# Patient Record
Sex: Female | Born: 2000 | Race: Black or African American | Hispanic: No | Marital: Single | Smoking: Never smoker
Health system: Southern US, Community
[De-identification: ages and names within clinical notes are randomized; demographics above are authoritative.]

## PROBLEM LIST (undated history)

## (undated) DIAGNOSIS — H539 Unspecified visual disturbance: Secondary | ICD-10-CM

## (undated) DIAGNOSIS — T7840XA Allergy, unspecified, initial encounter: Secondary | ICD-10-CM

## (undated) DIAGNOSIS — J45909 Unspecified asthma, uncomplicated: Secondary | ICD-10-CM

## (undated) DIAGNOSIS — F819 Developmental disorder of scholastic skills, unspecified: Secondary | ICD-10-CM

## (undated) HISTORY — DX: Developmental disorder of scholastic skills, unspecified: F81.9

## (undated) HISTORY — DX: Allergy, unspecified, initial encounter: T78.40XA

## (undated) HISTORY — DX: Unspecified visual disturbance: H53.9

## (undated) HISTORY — DX: Unspecified asthma, uncomplicated: J45.909

---
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
## 2022-01-01 ENCOUNTER — Encounter: Payer: Self-pay | Admitting: Student

## 2022-01-01 ENCOUNTER — Ambulatory Visit (INDEPENDENT_AMBULATORY_CARE_PROVIDER_SITE_OTHER): Payer: Medicaid Other | Admitting: Student

## 2022-01-01 ENCOUNTER — Other Ambulatory Visit: Payer: Self-pay

## 2022-01-01 VITALS — BP 126/64 | HR 88 | Temp 98.0°F | Ht 67.44 in | Wt 161.8 lb

## 2022-01-01 DIAGNOSIS — Z719 Counseling, unspecified: Secondary | ICD-10-CM

## 2022-01-01 DIAGNOSIS — L853 Xerosis cutis: Secondary | ICD-10-CM

## 2022-01-01 DIAGNOSIS — R55 Syncope and collapse: Secondary | ICD-10-CM

## 2022-01-01 DIAGNOSIS — Z23 Encounter for immunization: Secondary | ICD-10-CM

## 2022-01-01 DIAGNOSIS — Z91018 Allergy to other foods: Secondary | ICD-10-CM | POA: Diagnosis not present

## 2022-01-01 DIAGNOSIS — J4530 Mild persistent asthma, uncomplicated: Secondary | ICD-10-CM

## 2022-01-01 MED ORDER — EPINEPHRINE 0.3 MG/0.3ML IJ SOAJ: INTRAMUSCULAR | 1 refills | Status: DC

## 2022-01-01 MED ORDER — ALBUTEROL SULFATE HFA 108 (90 BASE) MCG/ACT IN AERS: INHALATION_SPRAY | RESPIRATORY_TRACT | 1 refills | Status: DC

## 2022-01-01 MED ORDER — FLUTICASONE PROPIONATE HFA 220 MCG/ACT IN AERO: INHALATION_SPRAY | RESPIRATORY_TRACT | 12 refills | Status: DC

## 2022-01-01 MED ORDER — TRIAMCINOLONE ACETONIDE 0.1 % EX OINT: TOPICAL_OINTMENT | CUTANEOUS | 0 refills | Status: DC

## 2022-01-04 ENCOUNTER — Ambulatory Visit (INDEPENDENT_AMBULATORY_CARE_PROVIDER_SITE_OTHER): Payer: Medicaid Other

## 2022-01-04 ENCOUNTER — Other Ambulatory Visit: Payer: Self-pay

## 2022-01-04 DIAGNOSIS — Z23 Encounter for immunization: Secondary | ICD-10-CM | POA: Diagnosis not present

## 2022-03-24 ENCOUNTER — Encounter: Payer: Self-pay | Admitting: Pediatrics

## 2022-03-24 ENCOUNTER — Ambulatory Visit (INDEPENDENT_AMBULATORY_CARE_PROVIDER_SITE_OTHER): Payer: Medicaid Other | Admitting: Pediatrics

## 2022-03-24 ENCOUNTER — Ambulatory Visit
Admission: RE | Admit: 2022-03-24 | Discharge: 2022-03-24 | Disposition: A | Discharge status: Home / Self Care | Payer: Medicaid Other | Source: Ambulatory Visit | Attending: Pediatrics | Admitting: Pediatrics

## 2022-03-24 VITALS — Wt 162.0 lb

## 2022-03-24 DIAGNOSIS — S99912A Unspecified injury of left ankle, initial encounter: Secondary | ICD-10-CM

## 2022-03-24 DIAGNOSIS — J4530 Mild persistent asthma, uncomplicated: Secondary | ICD-10-CM

## 2022-03-24 DIAGNOSIS — M7989 Other specified soft tissue disorders: Secondary | ICD-10-CM | POA: Diagnosis not present

## 2022-03-24 DIAGNOSIS — L853 Xerosis cutis: Secondary | ICD-10-CM

## 2022-03-24 DIAGNOSIS — Z91018 Allergy to other foods: Secondary | ICD-10-CM

## 2022-03-24 MED ORDER — FLUTICASONE PROPIONATE HFA 220 MCG/ACT IN AERO: INHALATION_SPRAY | RESPIRATORY_TRACT | 12 refills | Status: DC

## 2022-03-24 MED ORDER — TRIAMCINOLONE ACETONIDE 0.1 % EX OINT: TOPICAL_OINTMENT | CUTANEOUS | 0 refills | Status: DC

## 2022-03-24 MED ORDER — EPINEPHRINE 0.3 MG/0.3ML IJ SOAJ: INTRAMUSCULAR | 1 refills | Status: DC

## 2022-03-24 MED ORDER — ALBUTEROL SULFATE HFA 108 (90 BASE) MCG/ACT IN AERS: INHALATION_SPRAY | RESPIRATORY_TRACT | 1 refills | Status: DC

## 2022-03-24 NOTE — Patient Instructions (Addendum)
[facility] ?Address: [address], [place], [place] [address] ?Hours:  ?Open ? Closes 5?PM ?Phone: [phone] ?Appointments: [email or web address] ? ?Ankle Sprain ?An ankle sprain is a stretch or tear in one of the tough tissues (ligaments) that connect the bones in your ankle. An ankle sprain can happen when the ankle rolls outward (inversion sprain) or inward (eversion sprain). ?What are the causes? ?This condition is caused by rolling or twisting the ankle. ?What increases the risk? ?You are more likely to develop this condition if you play sports. ?What are the signs or symptoms? ?Symptoms of this condition include: ?Pain in your ankle. ?Swelling. ?Bruising. This may happen right after you sprain your ankle or 1-2 days later. ?Trouble standing or walking. ?How is this diagnosed? ?This condition is diagnosed with: ?A physical exam. During the exam, your doctor will press on certain parts of your foot and ankle and try to move them in certain ways. ?X-ray imaging. These may be taken to see how bad the sprain is and to check for broken bones. ?How is this treated? ?This condition may be treated with: ?A brace or splint. This is used to keep the ankle from moving until it heals. ?An elastic bandage. This is used to support the ankle. ?Crutches. ?Pain medicine. ?Surgery. This may be needed if the sprain is very bad. ?Physical therapy. This may help to improve movement in the ankle. ?Follow these instructions at home: ?If you have a brace or a splint: ?Wear the brace or splint as told by your doctor. Remove it only as told by your doctor. ?Loosen the brace or splint if your toes: ?Tingle. ?Lose feeling (become numb). ?Turn cold and blue. ?Keep the brace or splint clean. ?If the brace or splint is not waterproof: ?Do not let it get wet. ?Cover it with a watertight covering when you take a bath or a shower. ?If you have an elastic bandage (dressing): ?Remove it to shower or bathe. ?Try not  to move your ankle much, but wiggle your toes from time to time. This helps to prevent swelling. ?Adjust the dressing if it feels too tight. ?Loosen the dressing if your foot: ?Loses feeling. ?Tingles. ?Becomes cold and blue. ?Managing pain, stiffness, and swelling ? ?Take over-the-counter and prescription medicines only as told by doctor. ?For 2-3 days, keep your ankle raised (elevated) above the level of your heart. ?If told, put ice on the injured area: ?If you have a removable brace or splint, remove it as told by your doctor. ?Put ice in a plastic bag. ?Place a towel between your skin and the bag. ?Leave the ice on for 20 minutes, 2-3 times a day. ?General instructions ?Rest your ankle. ?Do not use your injured leg to support your body weight until your doctor says that you can. Use crutches as told by your doctor. ?Do not use any products that contain nicotine or tobacco, such as cigarettes, e-cigarettes, and chewing tobacco. If you need help quitting, ask your doctor. ?Keep all follow-up visits as told by your doctor. ?Contact a doctor if: ?Your bruises or swelling are quickly getting worse. ?Your pain does not get better after you take medicine. ?Get help right away if: ?You cannot feel your toes or foot. ?Your foot or toes look blue. ?You have very bad pain that gets worse. ?Summary ?An ankle sprain is a stretch or tear in one of the tough tissues (ligaments) that connect the bones in your ankle. ?This condition is  caused by rolling or twisting the ankle. ?Symptoms include pain, swelling, bruising, and trouble walking. ?To help with pain and swelling, put ice on the injured ankle, raise your ankle above the level of your heart, and use an elastic bandage. Also, rest as told by your doctor. ?Keep all follow-up visits as told by your doctor. This is important. ?This information is not intended to replace advice given to you by your health care provider. Make sure you discuss any questions you have with your  health care provider. ?Document Revised: 01/31/2021 Document Reviewed: 01/31/2021 ?Elsevier Patient Education ? 2022 [employer]vier Inc[employer] ? ?

## 2022-03-24 NOTE — Progress Notes (Signed)
Subjective:  ?  ?Michelle Lynn is a 21 y.o. old female here with her  self  for Ankle Pain (Left ankle started Thursday during track, pt states that she noticed some swelling and been putting ice on it. Been taking tylenol for pain. ) ?.   ? ?HPI ?Chief Complaint  ?Patient presents with  ? Ankle Pain  ?  Left ankle started Thursday during track, pt states that she noticed some swelling and been putting ice on it. Been taking tylenol for pain.   ? ?20yo here for L ankle injury.  Thurs, pt was at track and Audiological scientist.  She was retrieving a disc (diskus throw) and another athlete threw a disc before making sure the field was clear. The disk hit pt in the anterior ankle.  Ankle felt numb and agitated.  It was bleeding and swollen when she arrived home. Friday pt took ibuprofen for pain.  Pt denies applying ice or wrapping the ankle. Today, pt states she was not able to go to school due to the pain w/ ambulation.  ? ?Review of Systems ? ?History and Problem List: ?Deanda has Mild persistent asthma; Myopia of both eyes; Learning disorder; Overweight, pediatric, BMI 85.0-94.9 percentile for age; Multiple food allergies; and  Chest pain with exercise on their problem list. ? ?Michelle Lynn  has a past medical history of Allergy, Asthma, Learning disorder (11/01/2013), Unspecified asthma(493.90) (11/01/2013), and Vision abnormalities. ? ?Immunizations needed: none ? ?   ?Objective:  ?  ?Wt 162 lb (73.5 kg)   BMI 25.04 kg/m?  ?Physical Exam ?Musculoskeletal:     ?   General: Swelling and signs of injury present.  ?   Comments: 1cm healing laceration on anterior L ankle. Swelling of L ankle w/ tenderness extending to midfoot. Pain w/ passive dorsiflexion.   ? ? ?   ?Assessment and Plan:  ? ?Michelle Lynn is a 21 y.o. old female with ? ?1. Left ankle injury, initial encounter ?Patient presents with signs / symptoms of ankle sprain from diskus trauma.  Clinical exam is consistent with this diagnosis.  No fracture noted on x-rays of affected  area.   Supportive care is recommended and may include the use of ibuprofen and/or acetaminophen for symptomatic pain relief.  Patient / caregiver has been advised on other supportive measures including limited weight bearing / limited use of affected extremity and possible use of orthopedic brace / boot if indicated.  Patient / caregiver also advised to seek orthopedic follow up if indicated based on symptoms and degree of injury. ? ?- DG Ankle Complete Left; Future ?- DME Crutches ?- Ambulatory referral to Orthopedics ? ?2. Mild persistent asthma without complication ?Refill needed. Did not have it filled when ordered 40mos ago. ?- albuterol (PROAIR HFA) 108 (90 Base) MCG/ACT inhaler; INHALE 2 PUFFS BY MOUTH EVERY 4 HOURS BEFPRE EXERCISE AS NEEDED FOR WHEEZING  Dispense: 17 g; Refill: 1 ?- fluticasone (FLOVENT HFA) 220 MCG/ACT inhaler; INHALE 2 PUFFS BY MOUTH TWICE DAILY EVERY DAY FOR ASTHMA  ? Strength: 220 MCG/ACT  Dispense: 12 g; Refill: 12 ? ?3. Food allergy ?Refill needed. Did not have it filled when ordered 55mo ago. ?- EPINEPHrine 0.3 mg/0.3 mL IJ SOAJ injection; INJECT INTO MUSCLE AS A SINGLE DOSE, NOTIFY 911 IF USED  Dispense: 2 each; Refill: 1 ? ?4. Dry skin dermatitis ?Refill needed.  Did not have it filled when ordered 56mos ago. ?- triamcinolone ointment (KENALOG) 0.1 %; Apply to dry, itchy rash BID for up to 2 weeks  at a time  Dispense: 80 g; Refill: 0 ? ?  ?No follow-ups on file. ? ?Marjory Sneddon, MD ? ?

## 2022-04-01 ENCOUNTER — Ambulatory Visit: Payer: Medicaid Other | Admitting: Family

## 2022-04-01 DIAGNOSIS — S8012XA Contusion of left lower leg, initial encounter: Secondary | ICD-10-CM | POA: Diagnosis not present

## 2022-04-01 DIAGNOSIS — M25572 Pain in left ankle and joints of left foot: Secondary | ICD-10-CM | POA: Diagnosis not present

## 2022-05-26 ENCOUNTER — Other Ambulatory Visit: Payer: Self-pay

## 2022-05-26 ENCOUNTER — Ambulatory Visit (INDEPENDENT_AMBULATORY_CARE_PROVIDER_SITE_OTHER): Payer: Medicaid Other | Admitting: Pediatrics

## 2022-05-26 VITALS — HR 89 | Temp 98.3°F | Wt 168.4 lb

## 2022-05-26 DIAGNOSIS — R1084 Generalized abdominal pain: Secondary | ICD-10-CM

## 2022-05-26 DIAGNOSIS — Z3202 Encounter for pregnancy test, result negative: Secondary | ICD-10-CM

## 2022-05-26 LAB — POCT URINALYSIS DIPSTICK
Bilirubin, UA: NEGATIVE
Blood, UA: NEGATIVE
Glucose, UA: NEGATIVE
Ketones, UA: NEGATIVE
Nitrite, UA: NEGATIVE
Protein, UA: POSITIVE — AB
Spec Grav, UA: 1.015 (ref 1.010–1.025)
pH, UA: 7 (ref 5.0–8.0)

## 2022-05-26 LAB — POCT URINE PREGNANCY: Preg Test, Ur: NEGATIVE

## 2022-05-26 NOTE — Addendum Note (Signed)
Addended by: Milda Smart on: 05/26/2022 11:49 AM   Modules accepted: Level of Service

## 2022-05-26 NOTE — Addendum Note (Signed)
Addended by: Horton Finer on: 05/26/2022 11:41 AM   Modules accepted: Orders

## 2022-05-26 NOTE — Progress Notes (Signed)
History was provided by the patient- mother was also present. Obtained sensitive information confidentially with patient.  Michelle Lynn is a 21 y.o. female who is here for lower abdominal pain x1 month.     HPI:    First started a few weeks ago in May 2023. Sharp pain- comes and goes. Unsure of triggers. Nothing makes it better- has not tried Tylenol or Ibuprofen. Movement does not worsen it. Past few weeks pain in lower abdomen and vagina No nausea, vomiting, diarrhea or constipation. No blood in the stool. Had pain w/ urination yesterday. No hematuria. Has had increased frequency in urination. No fever or chills. No cough, SOB.  No new foods. LMP was beginning of May- normal in duration. Had cramping but not worse than usual. No change in vaginal discharge. No vaginal odor.  I had Mom step out of room to discuss confidential matters- Denies sexual activity current or past. Not on any contraceptive.  The following portions of the patient's history were reviewed and updated as appropriate: allergies, current medications, past family history, past medical history, past social history, past surgical history, and problem list.  Physical Exam:  Pulse 89   Temp 98.3 F (36.8 C) (Oral)   Wt 168 lb 6.4 oz (76.4 kg)   SpO2 100%   BMI 26.03 kg/m   Blood pressure percentiles are not available for patients who are 18 years or older.  No LMP recorded.    General:   alert, cooperative, appears stated age, and no distress     Skin:   normal  Oral cavity:   lips, mucosa, and tongue normal; teeth and gums normal  Eyes:   sclerae white, pupils equal and reactive     Nose: clear, no discharge     Lungs:  clear to auscultation bilaterally  Heart:   regular rate and rhythm, S1, S2 normal, no murmur, click, rub or gallop   Abdomen:  Soft, tender in RLQ but no rebound or guarding, mild suprapubic tenderness as well. Bowel sounds normal.  GU:  not examined  Extremities:   extremities normal,  atraumatic, no cyanosis or edema  Neuro:  normal without focal findings, mental status, speech normal, alert and oriented x3, PERLA, and reflexes normal and symmetric    Assessment/Plan: Michelle Lynn is an otherwise healthy 21 year-old presenting with abdominal pain. She is well-appearing, well-nourished and in no distress. Exam overall reassuring for an acute process such as appendicitis, nephrolithiasis (no CVA tenderness or hematuria). Urine pregnancy negative- no concern for ectopic pregnancy. Vital signs are stable.  Urinalysis in clinic today negative for UTI (did have 1+ leuks, negative nitrites) but will send for culture.  Question if she may have chronic pelvic pain vs. Endometriosis given chronic nature of pain with radiation into groin/labia.   Generalized lower abdominal pain - Ibuprofen 400 mg q6-8h PRN for pain w/ food - Ice or heat to painful areas - Ensure plenty of fluid hydration - Return precautions provided and discussed if symptoms worsen. - Follow-up in a few weeks to ensure improvement - Can consider gynecology referral for nature of pain in groin  - Follow-up visit in 2-3 weeks for well-child visit, or sooner as needed.   Orvis Brill, DO  05/26/22

## 2022-05-26 NOTE — Addendum Note (Signed)
Addended by: Horton Finer on: 05/26/2022 12:01 PM   Modules accepted: Orders

## 2022-05-26 NOTE — Patient Instructions (Addendum)
It was great meeting you today.   We tested you for a urinary tract infection today which showed no infection. We will call you if the culture is positive.  For your pain, I recommend using Ibuprofen (also called Advil or Motrin) 400 mg every 6-8 hours as needed- make sure to take with food and water to prevent stomach upset. You can also try ice or heat. Continue drinking plenty of water.  We will see you back in a few weeks to make sure you are improving.  If your pain becomes severe, you develop a fever > 100.4 F, or feel dizzy/faint please seek urgent care.  Best wishes, Dr. Melissa Noon

## 2022-05-27 ENCOUNTER — Encounter: Payer: Self-pay | Admitting: Pediatrics

## 2022-05-27 LAB — URINE CULTURE
MICRO NUMBER:: 13484601
SPECIMEN QUALITY:: ADEQUATE

## 2022-06-18 ENCOUNTER — Ambulatory Visit (INDEPENDENT_AMBULATORY_CARE_PROVIDER_SITE_OTHER): Payer: Medicaid Other | Admitting: Pediatrics

## 2022-06-18 VITALS — Temp 97.6°F | Wt 172.0 lb

## 2022-06-18 DIAGNOSIS — N3000 Acute cystitis without hematuria: Secondary | ICD-10-CM | POA: Diagnosis not present

## 2022-06-18 DIAGNOSIS — B3731 Acute candidiasis of vulva and vagina: Secondary | ICD-10-CM

## 2022-06-18 DIAGNOSIS — R300 Dysuria: Secondary | ICD-10-CM | POA: Diagnosis not present

## 2022-06-18 DIAGNOSIS — Z3202 Encounter for pregnancy test, result negative: Secondary | ICD-10-CM | POA: Diagnosis not present

## 2022-06-18 LAB — POCT URINALYSIS DIPSTICK
Bilirubin, UA: NEGATIVE
Blood, UA: NEGATIVE
Glucose, UA: NEGATIVE
Ketones, UA: NEGATIVE
Nitrite, UA: NEGATIVE
Protein, UA: NEGATIVE
Spec Grav, UA: 1.015 (ref 1.010–1.025)
Urobilinogen, UA: 0.2 E.U./dL
pH, UA: 7 (ref 5.0–8.0)

## 2022-06-18 LAB — POCT URINE PREGNANCY: Preg Test, Ur: NEGATIVE

## 2022-06-18 MED ORDER — CEPHALEXIN 500 MG PO CAPS: 500.0000 mg | ORAL_CAPSULE | Freq: Two times a day (BID) | ORAL | 0 refills | Status: AC

## 2022-06-18 MED ORDER — FLUCONAZOLE 150 MG PO TABS: ORAL_TABLET | ORAL | 0 refills | Status: DC

## 2022-06-18 NOTE — Patient Instructions (Addendum)
It was great to see you! Thank you for allowing me to participate in your care!  Our plans for today:  - Take Cephalexin (Keflex) antibiotic tablet daily for 7 days  - After completing the Keflex, take the Fluconazole tablet to treat your yeast infection - In the meantime, you can use an over the counter treatment for the yeast infection called Monistat  We are checking some labs today, I will call you if they are abnormal will send you a MyChart message or a letter if they are normal.  If you do not hear about your labs in the next 2 weeks please let us know.  Take care  Dr. Darral Dash, DO [facility]

## 2022-06-18 NOTE — Progress Notes (Signed)
Acute Office Visit  Subjective:     Patient ID: Michelle Lynn, female    DOB: 26-Oct-2001, 21 y.o.   MRN: [id number]  Chief Complaint  Patient presents with   Vaginal Discharge    HPI Patient is in today for burning with urination, increased frequency, hesitancy Had period 2 weeks ago, ended last week- was a bit more painful than usual. She says it hurts every time she urinates and feels like she needs to pee again after just going. She has also had change in vaginal discharge- white, chunky and started 9 days ago. No itching. No odor.  No nausea, fever, or chills.   Review of Systems  Constitutional:  Negative for chills and fever.  Gastrointestinal:  Positive for abdominal pain. Negative for constipation, diarrhea, nausea and vomiting.  Genitourinary:  Positive for dysuria, frequency and urgency. Negative for flank pain and hematuria.       Objective:    Temp 97.6 F (36.4 C) (Temporal)   Wt 172 lb (78 kg)   LMP 06/04/2022   BMI 26.59 kg/m    Physical Exam Constitutional:      General: She is not in acute distress.    Appearance: Normal appearance. She is normal weight.  HENT:     Mouth/Throat:     Mouth: Mucous membranes are moist.     Pharynx: Oropharynx is clear.  Eyes:     Extraocular Movements: Extraocular movements intact.     Conjunctiva/sclera: Conjunctivae normal.     Pupils: Pupils are equal, round, and reactive to light.  Cardiovascular:     Rate and Rhythm: Normal rate and regular rhythm.  Pulmonary:     Effort: Pulmonary effort is normal.     Breath sounds: Normal breath sounds.  Abdominal:     General: Bowel sounds are normal.     Palpations: Abdomen is soft.     Tenderness: There is abdominal tenderness (suprapubic). There is no right CVA tenderness, left CVA tenderness, guarding or rebound.  Skin:    General: Skin is warm and dry.     Capillary Refill: Capillary refill takes less than 2 seconds.  Neurological:     Mental Status: She  is alert.     Results for orders placed or performed in visit on 06/18/22  POCT urinalysis dipstick  Result Value Ref Range   Color, UA yellow    Clarity, UA     Glucose, UA Negative Negative   Bilirubin, UA negative    Ketones, UA negative    Spec Grav, UA 1.015 1.010 - 1.025   Blood, UA negative    pH, UA 7.0 5.0 - 8.0   Protein, UA Negative Negative   Urobilinogen, UA 0.2 0.2 or 1.0 E.U./dL   Nitrite, UA neg    Leukocytes, UA Moderate (2+) (A) Negative   Appearance     Odor          Assessment & Plan:  Michelle Lynn is here with UTI symptoms and change in vaginal discharge.  Acute cystitis without hematuria Likely urinary tract infection given moderate leukocytes on UA, urinary hesitancy/frequency/burning.  No signs of upper tract infection-no nausea, fever, chills, CVA tenderness. We will treat with p.o. Keflex 500 mg twice daily x7 days.  Return precautions discussed.  Discussed preventative measures against UTIs. Patient self swabbed for gonorrhea/chlamydia, results pending.  2.  Candidal vaginitis Symptoms consistent with candidal vaginitis.  Patient self swabbed today to obtain wet prep, results pending.  Prescribed Diflucan 150 mg x  1 to take after completing the antibiotic course as listed above.  In the meantime, educated patient on using Monistat for symptom relief.    Problem List Items Addressed This Visit   None Visit Diagnoses     Burning with urination    -  Primary   Relevant Orders   POCT urinalysis dipstick (Completed)   Urine Culture   POCT urine pregnancy   C. trachomatis/N. gonorrhoeae RNA   WET PREP BY MOLECULAR PROBE       Meds ordered this encounter  Medications   cephALEXin (KEFLEX) 500 MG capsule    Sig: Take 1 capsule (500 mg total) by mouth 2 (two) times daily for 7 days.    Dispense:  14 capsule    Refill:  0   fluconazole (DIFLUCAN) 150 MG tablet    Sig: Take 1 tablet by mouth after completing antibiotic course for UTI     Dispense:  1 tablet    Refill:  0    Return if symptoms worsen or fail to improve.  Darral Dash, DO

## 2022-06-19 LAB — URINE CULTURE
MICRO NUMBER:: 13584957
SPECIMEN QUALITY:: ADEQUATE

## 2022-06-19 LAB — C. TRACHOMATIS/N. GONORRHOEAE RNA
C. trachomatis RNA, TMA: NOT DETECTED
N. gonorrhoeae RNA, TMA: NOT DETECTED

## 2022-06-19 LAB — WET PREP BY MOLECULAR PROBE
Candida species: DETECTED — AB
Gardnerella vaginalis: NOT DETECTED
MICRO NUMBER:: 13584692
SPECIMEN QUALITY:: ADEQUATE
Trichomonas vaginosis: NOT DETECTED

## 2022-07-17 ENCOUNTER — Telehealth: Payer: Self-pay | Admitting: Pediatrics

## 2022-07-17 NOTE — Telephone Encounter (Signed)
I attempted 3 separate phone calls on 3 consecutive days to inform patient and/or patient's caregiver that they received a COVID-19 vaccination past it's effective date based on the length of time out of the deep freezer from [facility]. Contact with the patient/caregiver was unsuccessful. A letter will be sent to inform the patient of their ability to become revaccinated free of charge.   

## 2022-08-04 ENCOUNTER — Encounter: Payer: Self-pay | Admitting: Pediatrics

## 2022-12-23 ENCOUNTER — Other Ambulatory Visit (HOSPITAL_COMMUNITY)
Admission: RE | Admit: 2022-12-23 | Discharge: 2022-12-23 | Disposition: A | Discharge status: Home / Self Care | Payer: Medicaid Other | Source: Ambulatory Visit | Attending: Pediatrics | Admitting: Pediatrics

## 2022-12-23 ENCOUNTER — Ambulatory Visit (INDEPENDENT_AMBULATORY_CARE_PROVIDER_SITE_OTHER): Payer: Medicaid Other | Admitting: Pediatrics

## 2022-12-23 VITALS — BP 100/60 | HR 99 | Temp 98.3°F | Ht 68.5 in | Wt 164.8 lb

## 2022-12-23 DIAGNOSIS — Z1339 Encounter for screening examination for other mental health and behavioral disorders: Secondary | ICD-10-CM | POA: Diagnosis not present

## 2022-12-23 DIAGNOSIS — Z23 Encounter for immunization: Secondary | ICD-10-CM

## 2022-12-23 DIAGNOSIS — Z0000 Encounter for general adult medical examination without abnormal findings: Secondary | ICD-10-CM | POA: Diagnosis not present

## 2022-12-23 DIAGNOSIS — Z1331 Encounter for screening for depression: Secondary | ICD-10-CM

## 2022-12-23 DIAGNOSIS — Z113 Encounter for screening for infections with a predominantly sexual mode of transmission: Secondary | ICD-10-CM | POA: Diagnosis not present

## 2022-12-23 DIAGNOSIS — J4530 Mild persistent asthma, uncomplicated: Secondary | ICD-10-CM

## 2022-12-23 DIAGNOSIS — L853 Xerosis cutis: Secondary | ICD-10-CM | POA: Diagnosis not present

## 2022-12-23 DIAGNOSIS — N760 Acute vaginitis: Secondary | ICD-10-CM | POA: Diagnosis not present

## 2022-12-23 DIAGNOSIS — Z130 Encounter for screening for diseases of the blood and blood-forming organs and certain disorders involving the immune mechanism: Secondary | ICD-10-CM

## 2022-12-23 DIAGNOSIS — Z13220 Encounter for screening for lipoid disorders: Secondary | ICD-10-CM | POA: Diagnosis not present

## 2022-12-23 DIAGNOSIS — Z68.41 Body mass index (BMI) pediatric, 5th percentile to less than 85th percentile for age: Secondary | ICD-10-CM | POA: Diagnosis not present

## 2022-12-23 DIAGNOSIS — Z91018 Allergy to other foods: Secondary | ICD-10-CM

## 2022-12-23 LAB — POCT HEMOGLOBIN: Hemoglobin: 9.7 g/dL — AB (ref 11–14.6)

## 2022-12-23 MED ORDER — EPINEPHRINE 0.3 MG/0.3ML IJ SOAJ: INTRAMUSCULAR | 1 refills | Status: DC

## 2022-12-23 MED ORDER — FLUTICASONE PROPIONATE HFA 220 MCG/ACT IN AERO: 2.0000 | INHALATION_SPRAY | Freq: Two times a day (BID) | RESPIRATORY_TRACT | 12 refills | Status: DC

## 2022-12-23 MED ORDER — TRIAMCINOLONE ACETONIDE 0.1 % EX OINT: TOPICAL_OINTMENT | CUTANEOUS | 1 refills | Status: DC

## 2022-12-23 MED ORDER — ALBUTEROL SULFATE HFA 108 (90 BASE) MCG/ACT IN AERS: INHALATION_SPRAY | RESPIRATORY_TRACT | 1 refills | Status: DC

## 2022-12-23 MED ORDER — CLOTRIMAZOLE 1 % EX CREA: 1.0000 | TOPICAL_CREAM | Freq: Two times a day (BID) | CUTANEOUS | 1 refills | Status: DC

## 2022-12-23 NOTE — Patient Instructions (Signed)
Start taking ferrous sulfate 325 mg tablets once daily or every other day to help with your anemia.

## 2022-12-23 NOTE — Progress Notes (Signed)
Adolescent Well Care Visit Michelle Lynn is a 22 y.o. female who is here for well care.    PCP:  Rae Lips, MD   History was provided by the patient.   Current Issues: Current concerns include: needs sports PE form  Vaginal irritation, no discharge.  Some peeling skin.   Asthma - She is prescribed flovent 220 mg inhaler 2 puffs once daily and albuterol inhaler 2 puffs every 4 hours prn wheezing.  Taking flovent 2 puffs BID.  Using albuterol before exercise and as needed.  Has not needed many doses except before exercise.   Food allergies - pineapple, needs refill on Epipen.  Eczema - Using triamcinolone ointment as needed.  Needs refill  Nutrition/Exercise: no concerns  Sleep: no concerns  Social Screening: Lives with:  mom, dad, 3 sisters, and 1 brother Parental relations:  good Activities, Work, and Research officer, political party?: working in the summer, thinking of applying for a job soon Concerns regarding behavior with peers?  no Stressors of note: no  Education: School Name: [employer] Grade: 12th School performance: in AP and honors classes, missed a lot of class from illness early in the school year, now grades are improving School Behavior: doing well; no concerns  Menstruation:   No LMP recorded. Menstrual History: regular, lasts 5-6 days, heavy for the first 2 days with some cramping and decreased appetite, improves with Aleve.     Confidential Social History: Tobacco?  no Secondhand smoke exposure?  no Drugs/ETOH?  no Sexually Active?  no    Screenings: Patient has a dental home: yes  The patient completed the Rapid Assessment for Adolescent Preventive Services screening questionnaire and the following topics were identified as risk factors and discussed: none  Additional topics were discussed as part of anticipatory guidance.  PHQ-9 completed and results indicated no signs of depression  Physical Exam:  Vitals:   12/23/22 1336  BP: 100/60  Pulse: 99   Temp: 98.3 F (36.8 C)  TempSrc: Oral  SpO2: 90%  Weight: 164 lb 12.8 oz (74.8 kg)  Height: 5' 8.5" (1.74 m)   BP 100/60 (BP Location: Right Arm, Patient Position: Sitting)   Pulse 99   Temp 98.3 F (36.8 C) (Oral)   Ht 5' 8.5" (1.74 m)   Wt 164 lb 12.8 oz (74.8 kg)   SpO2 90%   BMI 24.69 kg/m  Body mass index: body mass index is 24.69 kg/m. Blood pressure %iles are not available for patients who are 18 years or older.  Hearing Screening   500Hz  1000Hz  2000Hz  5000Hz   Right ear Fail 20 20 20   Left ear Fail 20 20 20    Vision Screening   Right eye Left eye Both eyes  Without correction     With correction 20/20 20/20 20/20     General Appearance:   alert, oriented, no acute distress and well nourished  HENT: Normocephalic, no obvious abnormality, conjunctiva clear  Mouth:   Normal appearing teeth, no obvious discoloration, dental caries, or dental caps  Neck:   Supple; thyroid: no enlargement, symmetric, no tenderness/mass/nodules  Lungs:   Clear to auscultation bilaterally, normal work of breathing  Heart:   Regular rate and rhythm, S1 and S2 normal, no murmurs;   Abdomen:   Soft, non-tender, no mass, or organomegaly  GU Tanner stage IV, erythema over the labia minora and adjacent skin, no vaginal discharge or other skin lesions  Musculoskeletal:   Tone and strength strong and symmetrical, all extremities  Lymphatic:   No cervical adenopathy  Skin/Hair/Nails:   Skin warm, dry and intact, no rashes, no bruises or petechiae  Neurologic:   Strength, gait, and coordination normal and age-appropriate     Assessment and Plan:   1. Encounter for general adult medical examination without abnormal findings Sports PE form compelted today  2. Body mass index, pediatric, 5th percentile to less than 85th percentile for age  40. Vulvovaginitis No vaginal discharge.  Rx for topical antifungal.  If she develops discharge or persistent symptoms in spite of topical  treatment, then would recommend oral antifungal.   - clotrimazole (LOTRIMIN) 1 % cream; Apply 1 Application topically 2 (two) times daily. For yeast skin rash  Dispense: 30 g; Refill: 1  4. Dry skin dermatitis - triamcinolone ointment (KENALOG) 0.1 %; Apply to dry, itchy rash BID for up to 2 weeks at a time  Dispense: 80 g; Refill: 1  5. Mild persistent asthma without complication - albuterol (PROAIR HFA) 108 (90 Base) MCG/ACT inhaler; INHALE 2 PUFFS BY MOUTH EVERY 4 HOURS BEFPRE EXERCISE AS NEEDED FOR WHEEZING  Dispense: 17 g; Refill: 1  6. Food allergy - EPINEPHrine 0.3 mg/0.3 mL IJ SOAJ injection; INJECT INTO MUSCLE AS A SINGLE DOSE, NOTIFY 911 IF USED  Dispense: 2 each; Refill: 1  7. Routine screening for STI (sexually transmitted infection) Patient denies sexual activity - at risk age group.  Consents to screening test. - Urine cytology ancillary only  8. Screening for hyperlipidemia - Lipid panel  9. Screening for deficiency anemia POC hemoglobin is low today at 9.7.  Recommend oral ferrous sulfate daily or every other day and then recheck POC HGb in 1-2 months.  If not improving with iron supplementation, then will obtain full CBC and iron studies to confirm iron deficiency anemia.   - POCT hemoglobin - 9.7   BMI is appropriate for age  Hearing screening result:normal Vision screening result: normal  Counseling provided for all of the vaccine components  Orders Placed This Encounter  Procedures   Flu Vaccine QUAD 61mo+IM (Fluarix, Fluzone & Alfiuria Quad PF)     Return for recheck anemia with Dr. Tami Ribas or Jevaun Strick in 1-2 months.Carmie End, MD

## 2022-12-24 LAB — URINE CYTOLOGY ANCILLARY ONLY
Chlamydia: NEGATIVE
Comment: NEGATIVE
Comment: NORMAL
Neisseria Gonorrhea: NEGATIVE

## 2023-09-01 ENCOUNTER — Encounter: Payer: Self-pay | Admitting: Pediatrics

## 2024-02-10 ENCOUNTER — Ambulatory Visit (INDEPENDENT_AMBULATORY_CARE_PROVIDER_SITE_OTHER): Payer: Medicaid Other | Admitting: Pediatrics

## 2024-02-10 ENCOUNTER — Encounter: Payer: Self-pay | Admitting: Pediatrics

## 2024-02-10 ENCOUNTER — Other Ambulatory Visit (HOSPITAL_COMMUNITY)
Admission: RE | Admit: 2024-02-10 | Discharge: 2024-02-10 | Disposition: A | Discharge status: Home / Self Care | Payer: Medicaid Other | Source: Ambulatory Visit | Attending: Pediatrics | Admitting: Pediatrics

## 2024-02-10 VITALS — BP 116/64 | HR 78 | Ht 67.91 in | Wt 175.0 lb

## 2024-02-10 DIAGNOSIS — D508 Other iron deficiency anemias: Secondary | ICD-10-CM | POA: Diagnosis not present

## 2024-02-10 DIAGNOSIS — Z130 Encounter for screening for diseases of the blood and blood-forming organs and certain disorders involving the immune mechanism: Secondary | ICD-10-CM

## 2024-02-10 DIAGNOSIS — Z3042 Encounter for surveillance of injectable contraceptive: Secondary | ICD-10-CM | POA: Diagnosis not present

## 2024-02-10 DIAGNOSIS — Z0000 Encounter for general adult medical examination without abnormal findings: Secondary | ICD-10-CM

## 2024-02-10 DIAGNOSIS — Z113 Encounter for screening for infections with a predominantly sexual mode of transmission: Secondary | ICD-10-CM | POA: Diagnosis not present

## 2024-02-10 DIAGNOSIS — Z0001 Encounter for general adult medical examination with abnormal findings: Secondary | ICD-10-CM | POA: Diagnosis not present

## 2024-02-10 DIAGNOSIS — Z3009 Encounter for other general counseling and advice on contraception: Secondary | ICD-10-CM

## 2024-02-10 DIAGNOSIS — Z23 Encounter for immunization: Secondary | ICD-10-CM | POA: Diagnosis not present

## 2024-02-10 DIAGNOSIS — M25561 Pain in right knee: Secondary | ICD-10-CM

## 2024-02-10 DIAGNOSIS — Z3202 Encounter for pregnancy test, result negative: Secondary | ICD-10-CM | POA: Diagnosis not present

## 2024-02-10 DIAGNOSIS — Z91018 Allergy to other foods: Secondary | ICD-10-CM | POA: Diagnosis not present

## 2024-02-10 DIAGNOSIS — Z68.41 Body mass index (BMI) pediatric, 5th percentile to less than 85th percentile for age: Secondary | ICD-10-CM

## 2024-02-10 DIAGNOSIS — R010 Benign and innocent cardiac murmurs: Secondary | ICD-10-CM

## 2024-02-10 DIAGNOSIS — M25562 Pain in left knee: Secondary | ICD-10-CM | POA: Diagnosis not present

## 2024-02-10 DIAGNOSIS — Z114 Encounter for screening for human immunodeficiency virus [HIV]: Secondary | ICD-10-CM | POA: Diagnosis not present

## 2024-02-10 DIAGNOSIS — I471 Supraventricular tachycardia, unspecified: Secondary | ICD-10-CM

## 2024-02-10 DIAGNOSIS — J4530 Mild persistent asthma, uncomplicated: Secondary | ICD-10-CM | POA: Diagnosis not present

## 2024-02-10 DIAGNOSIS — K644 Residual hemorrhoidal skin tags: Secondary | ICD-10-CM

## 2024-02-10 LAB — POCT HEMOGLOBIN: Hemoglobin: 8.6 g/dL — AB (ref 11–14.6)

## 2024-02-10 LAB — POCT RAPID HIV: Rapid HIV, POC: NEGATIVE

## 2024-02-10 LAB — POCT URINE PREGNANCY: Preg Test, Ur: NEGATIVE

## 2024-02-10 MED ORDER — MEDROXYPROGESTERONE ACETATE 150 MG/ML IM SUSP
150.0000 mg | Freq: Once | INTRAMUSCULAR | Status: AC
  Administered 2024-02-10: 150 mg via INTRAMUSCULAR

## 2024-02-10 MED ORDER — FERROUS SULFATE 325 (65 FE) MG PO TABS: 325.0000 mg | ORAL_TABLET | Freq: Every day | ORAL | 3 refills | Status: AC

## 2024-02-10 MED ORDER — ALBUTEROL SULFATE HFA 108 (90 BASE) MCG/ACT IN AERS: INHALATION_SPRAY | RESPIRATORY_TRACT | 1 refills | Status: DC

## 2024-02-10 MED ORDER — FLUTICASONE PROPIONATE HFA 220 MCG/ACT IN AERO: 2.0000 | INHALATION_SPRAY | Freq: Two times a day (BID) | RESPIRATORY_TRACT | 12 refills | Status: DC

## 2024-02-10 MED ORDER — EPINEPHRINE 0.3 MG/0.3ML IJ SOAJ
INTRAMUSCULAR | 1 refills | Status: AC
Start: 2024-02-10 — End: ?

## 2024-02-10 NOTE — Patient Instructions (Signed)
 Adult Primary Care Clinics Name Criteria Services   [facility]  Address: [address] [place], [place] [address]  Phone: [phone] Hours: Monday - Friday 9 AM -6 PM  Types of insurance accepted:  Commercial insurance [place] [employer] (orange card) [employer] Uninsured  Language services:  Video and phone interpreters available   Ages 73 and older    Adult primary care Onsite pharmacy Integrated behavioral health Financial assistance counseling Walk-in hours for established patients  Financial assistance counseling hours: Tuesdays 2:00PM - 5:00PM  Thursday 8:30AM - 4:30PM  Space is limited, 10 on Tuesday and 20 on Thursday. It's on first come first serve basis  Name Criteria Services   [facility]  Address: [address] [place], [place] [address]  Phone: [phone]  Hours: Monday - Friday 8:30 AM - 5 PM  Types of insurance accepted:  Commercial insurance Medicaid Medicare Uninsured  Language services:  Video and phone interpreters available   All ages - newborn to adult   Primary care for all ages (children and adults) Integrated behavioral health Nutritionist Financial assistance counseling   Name Criteria Services   [facility]  Located on the ground floor of [facility]  Address: [address]  [place],  [place]  [address]  Phone: [phone]  Hours: Monday - Friday 8:15 AM - 5 PM  Types of insurance accepted:  Commercial insurance Medicaid Medicare Uninsured  Language services:  Video and phone interpreters available   Ages 41 and older   Adult primary care Nutritionist Certified Diabetes Educator  Integrated behavioral health Financial assistance counseling   Name Criteria Services   [facility]  Address: [address] [place], [place] [address]  Phone:  [phone]  Hours: Monday - Friday 8:30 AM - 5 PM    Types of insurance accepted:  Nurse, learning disability Medicaid Medicare Uninsured  Language services:  Video and phone interpreters available   All ages - newborn to adult   Primary care for all ages (children and adults) Integrated behavioral health Financial assistance counseling

## 2024-02-10 NOTE — Progress Notes (Signed)
Adolescent Well Care Visit Michelle Lynn is a 23 y.o. female who is here for well care.    PCP:  Kalman Jewels, MD   History was provided by the patient.  Confidentiality was discussed with the patient and, if applicable, with caregiver as well. Patient's personal or confidential phone number: [phone]   Current Issues: Current concerns include:  This 23 year old is here for an annual CPE with multiple concerns today.   Has intermittent knee pain for the past 3 weeks. Attends [employer] and climbs stairs a lot. No swelling. No prior knee issues. No known trauma. Not exercising regularly at this time.   External hemorrhoid noted over the past 2 months. It does not hurt. It does not bleed. It does not itch. Denies constipation-soft BM every day. Used Preparation H off and on for the past 2 months and has not seen a difference.   History SVT with dental procedure-Saw cardiology 09/2018 for chest pain-ECHO and EKG normal. Has not seen cardiology since episode of SVT while getting dental procedure Has occasional feeling of panic and rapid heart racing-occurred once this year and once last year. No syncope or near syncope. HR comes down with relaxation and water over 30-40 minutes.   Last CPE 12/2022: Asthma - She is prescribed flovent 220 mg inhaler 2 puffs once daily and albuterol inhaler 2 puffs every 4 hours prn wheezing.  Taking flovent 2 puffs BID.  Using albuterol before exercise and as needed.  Needs refill today.   Food allergies - pineapple, has Epipen. Needs refill today   Eczema - Using triamcinolone ointment as needed.    Hgb 9.7-treated with iron-did not follow up for recheck-per patient she never took iron supplement  Patient is also sexually active with one partner. They use condoms. Last period 1 months ago. Last sexual intercourse with condom 1-2 weeks ago. Would like LARC and interested in bridge contraception today  Nutrition: Nutrition/Eating Behaviors: trying to  eat more high fiber foods Adequate calcium in diet?: yes Supplements/ Vitamins: no  Exercise/ Media: Play any Sports?/ Exercise: rarely at this time Screen Time:  > 2 hours-counseling provided Media Rules or Monitoring?: no  Sleep:  Sleep: difficulty falling asleep-poor sleep hygiene and excessive screen tome at night  Social Screening: Lives with:  family Parental relations:  good Attends college-wants to be medical interpreter  Education: School Name: [employer]    Menstruation:   Patient's last menstrual period was 01/06/2024. Menstrual History: 1 month ago. Time for period to start.    Confidential Social History: Tobacco?  no Secondhand smoke exposure?  no Drugs/ETOH?  no  Sexually Active?  yes   Pregnancy Prevention: condoms-interested in LARC  Safe at home, in school & in relationships?  Yes Safe to self?  Yes   Screenings: Patient has a dental home: yes  The patient completed the Rapid Assessment for Adolescent Preventive Services screening questionnaire and the following topics were identified as risk factors and discussed: healthy eating, exercise, condom use, birth control, sexuality, and mental health issues    PHQ-9 completed and results indicated low risk score 2     02/10/2024   10:10 AM 09/20/2018    3:02 PM  Depression screen PHQ 2/9  Decreased Interest 0 0  Down, Depressed, Hopeless 0 0  PHQ - 2 Score 0 0  Altered sleeping  0  Tired, decreased energy  1  Change in appetite  0  Feeling bad or failure about yourself   0  Trouble  concentrating  0  Moving slowly or fidgety/restless  0  PHQ-9 Score  1     Physical Exam:  Vitals:   02/10/24 1002  BP: 116/64  Pulse: 78  SpO2: 99%  Weight: 175 lb (79.4 kg)  Height: 5' 7.91" (1.725 m)   BP 116/64 (BP Location: Right Arm, Patient Position: Sitting, Cuff Size: Normal)   Pulse 78   Ht 5' 7.91" (1.725 m)   Wt 175 lb (79.4 kg)   LMP 01/06/2024   SpO2 99%   BMI 26.68 kg/m  Body mass index:  body mass index is 26.68 kg/m. Growth %ile SmartLinks can only be used for patients less than 43 years old.  Hearing Screening  Method: Audiometry   500Hz  1000Hz  2000Hz  4000Hz   Right ear 25 20 20 20   Left ear 25 20 20 20    Vision Screening   Right eye Left eye Both eyes  Without correction 20/20 20/20 20/20   With correction     Comments: Don't have glasses today     General Appearance:   alert, oriented, no acute distress  HENT: Normocephalic, no obvious abnormality, conjunctiva clear  Mouth:   Normal appearing teeth, no obvious discoloration, dental caries, or dental caps  Neck:   Supple; thyroid: no enlargement, symmetric, no tenderness/mass/nodules  Chest Tanner 5-self breast exam reviewed. Normal breast exam today  Lungs:   Clear to auscultation bilaterally, normal work of breathing  Heart:   Regular rate and rhythm, S1 and S2 normal, no murmurs;   Abdomen:   Soft, non-tender, no mass, or organomegaly  GU normal female external genitalia, pelvic not performed Perianal area with 3 small skin tags. No obvious genital warts.   Musculoskeletal:   Tone and strength strong and symmetrical, all extremities               Lymphatic:   No cervical adenopathy  Skin/Hair/Nails:   Skin warm, dry and intact, no rashes, no bruises or petechiae  Neurologic:   Strength, gait, and coordination normal and age-appropriate    Results for orders placed or performed in visit on 02/10/24 (from the past 24 hours)  POCT Rapid HIV     Status: Normal   Collection Time: 02/10/24 10:36 AM  Result Value Ref Range   Rapid HIV, POC Negative   POCT hemoglobin     Status: Abnormal   Collection Time: 02/10/24 10:42 AM  Result Value Ref Range   Hemoglobin 8.6 (A) 11 - 14.6 g/dL  POCT urine pregnancy     Status: Normal   Collection Time: 02/10/24 11:07 AM  Result Value Ref Range   Preg Test, Ur Negative Negative     Assessment and Plan:   1. Encounter for general adult medical examination without  abnormal findings 23 year old here for annual CPE Normal BMI Concerns today are outlined below  BMI is appropriate for age  Hearing screening result:normal Vision screening result: normal  Counseling provided for all of the vaccine components  Orders Placed This Encounter  Procedures   Flu vaccine trivalent PF, 6mos and older(Flulaval,Afluria,Fluarix,Fluzone)   Hemoglobinopathy Evaluation   CBC with Differential/Platelet   Ferritin   Reticulocytes   Ambulatory referral to Pediatric Cardiology   Ambulatory referral to Physical Therapy   POCT Rapid HIV   POCT hemoglobin   POCT urine pregnancy      2. Body mass index, pediatric, 5th percentile to less than 85th percentile for age Counseled regarding 5-2-1-0 goals of healthy active living including:  -  eating at least 5 fruits and vegetables a day - at least 1 hour of activity - no sugary beverages - eating three meals each day with age-appropriate servings - age-appropriate screen time - age-appropriate sleep patterns    3. Birth control counseling Urine pregnancy negative today Depo provera given in clinic and appointment made for adolescent clinic to discuss LARC and monitor perianal skin tag-no current evidence of genital warts but needs monitoring  - POCT urine pregnancy - medroxyPROGESTERone (DEPO-PROVERA) injection 150 mg  4. Mild persistent asthma without complication Reviewed proper inhaler and spacer use. Reviewed return precautions and to return for more frequent or severe symptoms.  - albuterol (PROAIR HFA) 108 (90 Base) MCG/ACT inhaler; INHALE 2 PUFFS BY MOUTH EVERY 4 HOURS BEFPRE EXERCISE AS NEEDED FOR WHEEZING  Dispense: 17 g; Refill: 1 - fluticasone (FLOVENT HFA) 220 MCG/ACT inhaler; Inhale 2 puffs into the lungs 2 (two) times daily.  Dispense: 1 each; Refill: 12  5. Food allergy Avoid pineapple Reviewed emergency management - EPINEPHrine 0.3 mg/0.3 mL IJ SOAJ injection; INJECT INTO MUSCLE AS A SINGLE  DOSE, NOTIFY 911 IF USED  Dispense: 2 each; Refill: 1  6. Other iron deficiency anemia  - Hemoglobinopathy Evaluation - CBC with Differential/Platelet - Ferritin - Reticulocytes; Future - ferrous sulfate 325 (65 FE) MG tablet; Take 1 tablet (325 mg total) by mouth daily with breakfast.  Dispense: 30 tablet; Refill: 3  7. SVT (supraventricular tachycardia) (HCC)  - Ambulatory referral to Pediatric Cardiology  8. Pain in both knees, unspecified chronicity  - Ambulatory referral to Physical Therapy  9. Screening examination for venereal disease (Primary)  - Urine cytology ancillary only  10. Screening for iron deficiency anemia  - POCT Rapid HIV - POCT hemoglobin  11. Need for vaccination Counseling provided on all components of vaccines given today and the importance of receiving them. All questions answered.Risks and benefits reviewed and guardian consents.  - Flu vaccine trivalent PF, 6mos and older(Flulaval,Afluria,Fluarix,Fluzone)  12. Innocent heart murmur   13. Skin tag of perianal region    Return for Adolescent clinic in next 2 months for nexplanon, recheck with Dr. Jenne Campus in 4-6 weeks for anemia..  Transition to adult health care  Kalman Jewels, MD

## 2024-02-11 LAB — URINE CYTOLOGY ANCILLARY ONLY
Chlamydia: NEGATIVE
Comment: NEGATIVE
Comment: NORMAL
Neisseria Gonorrhea: NEGATIVE

## 2024-02-15 LAB — HEMOGLOBINOPATHY EVALUATION
Fetal Hemoglobin Testing: <1 % (ref 0.0–1.9)
HCT: 32.5 % — ABNORMAL LOW (ref 35.0–45.0)
Hemoglobin A2 - HGBRFX: 1.7 % — ABNORMAL LOW (ref 2.0–3.2)
Hemoglobin: 8.5 g/dL — ABNORMAL LOW (ref 11.7–15.5)
Hgb A: 98.3 % (ref >96.0)
MCH: 16.3 pg — ABNORMAL LOW (ref 27.0–33.0)
MCV: 62.4 fL — ABNORMAL LOW (ref 80.0–100.0)
RBC: 5.21 10*6/uL — ABNORMAL HIGH (ref 3.80–5.10)
RDW: 20.4 % — ABNORMAL HIGH (ref 11.0–15.0)

## 2024-02-15 LAB — CBC WITH DIFFERENTIAL/PLATELET
Absolute Lymphocytes: 1470 {cells}/uL (ref 850–3900)
Absolute Monocytes: 527 {cells}/uL (ref 200–950)
Basophils Absolute: 39 {cells}/uL (ref 0–200)
Basophils Relative: 1 %
Eosinophils Absolute: 51 {cells}/uL (ref 15–500)
Eosinophils Relative: 1.3 %
HCT: 31.5 % — ABNORMAL LOW (ref 35.0–45.0)
Hemoglobin: 8.5 g/dL — ABNORMAL LOW (ref 11.7–15.5)
MCH: 16.3 pg — ABNORMAL LOW (ref 27.0–33.0)
MCHC: 27 g/dL — ABNORMAL LOW (ref 32.0–36.0)
MCV: 60.3 fL — ABNORMAL LOW (ref 80.0–100.0)
MPV: 9.8 fL (ref 7.5–12.5)
Monocytes Relative: 13.5 %
Neutro Abs: 1814 {cells}/uL (ref 1500–7800)
Neutrophils Relative %: 46.5 %
Platelets: 445 10*3/uL — ABNORMAL HIGH (ref 140–400)
RBC: 5.22 10*6/uL — ABNORMAL HIGH (ref 3.80–5.10)
RDW: 20.1 % — ABNORMAL HIGH (ref 11.0–15.0)
Total Lymphocyte: 37.7 %
WBC: 3.9 10*3/uL (ref 3.8–10.8)

## 2024-02-15 LAB — CBC MORPHOLOGY

## 2024-02-15 LAB — FERRITIN: Ferritin: 3 ng/mL — ABNORMAL LOW (ref 16–154)

## 2024-02-18 NOTE — Therapy (Unsigned)
 OUTPATIENT PHYSICAL THERAPY LOWER EXTREMITY EVALUATION   Patient Name: Michelle Lynn MRN: [id number] DOB:01-02-2001, 23 y.o., female Today's Date: 02/19/2024  END OF SESSION:  PT End of Session - 02/19/24 1109     Visit Number 1    Number of Visits 8    Date for PT Re-Evaluation 04/15/24    Authorization Type [place] [employer]    PT Start Time 1104    PT Stop Time 1145    PT Time Calculation (min) 41 min    Activity Tolerance Patient tolerated treatment well    Behavior During Therapy [facility] for tasks assessed/performed             Past Medical History:  Diagnosis Date   Allergy    Asthma    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  Rationale for Evaluation and Treatment: Rehabilitation  ONSET DATE: 2 mos ago   SUBJECTIVE:   SUBJECTIVE STATEMENT: She noticed knee pain about 2 mos ago when she started taking classes at [facility].   She used to be more active and she thinks it may because of her lack of exercise.  She played basketball, track in HS.  Prior to this injury she did workout at the gym, running on track.   She has pain, stiffness and difficulty getting up and down stairs.  She has used braces but that did not really help. She has not had PT before.   PERTINENT HISTORY: Asthma  PAIN:  Are you having pain? Yes: NPRS scale: 5/10 Pain location: anterior knees Pain description: stiffness, pain, aching  Aggravating factors: weightbearing ,  bending knees  Relieving factors: rest  PRECAUTIONS: None  RED FLAGS: None   WEIGHT BEARING RESTRICTIONS: No  FALLS:  Has patient fallen in last 6 months? No has recently had to use the rails due to feeling off balance   LIVING ENVIRONMENT: Lives with: lives with their family Lives in: House/apartment Stairs: Yes: Internal: 12 steps; on right going up Has following equipment at home: None  OCCUPATION: Consulting civil engineer at [employer]    PLOF: Independent, Vocation/Vocational requirements: walking and getting around school, Leisure: friends, and painting  PATIENT GOALS: Improve her knee pain, improve squatting   NEXT MD VISIT: as needed   OBJECTIVE:  Note: Objective measures were completed at Evaluation unless otherwise noted.  DIAGNOSTIC FINDINGS: none  PATIENT SURVEYS:  LEFS 33/80  COGNITION: Overall cognitive status: Within functional limits for tasks assessed     SENSATION: WFL  EDEMA:   POSTURE:  genu recurvatum , min genu valgus    PALPATION: Min pain bilateral distal to patella and laterally   LOWER EXTREMITY ROM:  Active ROM Right eval Left eval  Hip flexion    Hip extension    Hip abduction    Hip adduction    Hip internal rotation    Hip external rotation    Knee flexion 132 133  Knee extension +5 +5  Ankle dorsiflexion    Ankle plantarflexion  Ankle inversion    Ankle eversion     (Blank rows = not tested)  LOWER EXTREMITY MMT:  MMT Right eval Left eval  Hip flexion    Hip extension 4 4  Hip abduction 4 4  Hip adduction    Hip internal rotation    Hip external rotation    Knee flexion 5 5  Knee extension 5 5  Ankle dorsiflexion    Ankle plantarflexion    Ankle inversion    Ankle eversion     (Blank rows = not tested)  LOWER EXTREMITY SPECIAL TESTS:  Hip special tests: Trendelenburg test: negative  FUNCTIONAL TESTS:  5 times sit to stand: 21.4 sec Single leg bridge 15 sec, mod diff Rt LE   GAIT: Distance walked:150  Assistive  device utilized: None Level of assistance: Modified independence Comments: slow to transfer to standing, no limp, improved velocity after session                                                                                                                                 TREATMENT DATE:  [facility] Adult PT Treatment:                                                DATE: 02/19/24  Therapeutic Activity: SLR, hip abd and flexion  Step ups  Single leg balance  Self Care: Discussed LE alignment and knee position      PATIENT EDUCATION:  Education details: self care, eval findings, POC, causes of knee pain  Person educated: Patient Education method: Explanation, Demonstration, and Handouts Education comprehension: verbalized understanding, returned demonstration, and needs further education  HOME EXERCISE PROGRAM: Access Code: [email or web address] URL: [email or web address] Date: 02/19/2024 Prepared by: Karie Mainland  Exercises - Single Leg Stance  - 1 x daily - 7 x weekly - 1 sets - 5 reps - 30 hold - Sidelying Hip Abduction  - 1 x daily - 7 x weekly - 2 sets - 10 reps - 5 hold - Active Straight Leg Raise with Quad Set  - 1 x daily - 7 x weekly - 2 sets - 10 reps - 5 hold - Straight Leg Raise with External Rotation  - 1 x daily - 7 x weekly - 2 sets - 10 reps - 5 hold  ASSESSMENT:  CLINICAL IMPRESSION: Patient is a 23 y.o. female who was seen today for physical therapy evaluation and treatment for bilateral knee pain, consistent with patellofemoral syndrome.  She has a notable trunk lean with step ups and compensates for hip weakness.    OBJECTIVE IMPAIRMENTS: Abnormal gait, decreased activity tolerance, decreased knowledge of use of DME, decreased mobility, difficulty walking, decreased ROM, decreased strength, increased fascial restrictions, and pain.   ACTIVITY LIMITATIONS: standing, squatting, stairs, transfers, and locomotion level  PARTICIPATION  LIMITATIONS: interpersonal  relationship, community activity, occupation, and school  PERSONAL FACTORS: 1 comorbidity: asthma  are also affecting patient's functional outcome.   REHAB POTENTIAL: Excellent  CLINICAL DECISION MAKING: Stable/uncomplicated  EVALUATION COMPLEXITY: Low   GOALS: Goals reviewed with patient? Yes  SHORT TERM GOALS: Target date: 03/04/2024   Pt will be I with HEP for hip and knee strength Baseline:unknown  Goal status: INITIAL  2.  Pt will be able to notice reduced stiffness when getting up in the AM, doing stairs, improved 25%  Baseline: mod Goal status: INITIAL   LONG TERM GOALS: Target date: 04/15/2024    Pt will be I with HEP for hip and knee strength, ROM  Baseline: unknown  Goal status: INITIAL  2.  Pt will improve sit to stand x 5 time to < 13 sec  Baseline: 21.4 sec  Goal status: INITIAL  3.  Pt will be able to balance on each LE in SLS for 30 sec  Baseline: 10 sec  Goal status: INITIAL  4.  Pt will be able to walk without increased knee pain for 30 min in the community Baseline: limited due to pain  Goal status: INITIAL  5.  LEFS score will improve to 45/80 at the end of the POC.  Baseline: 33/80 Goal status: INITIAL    PLAN:  PT FREQUENCY: 1x/week  PT DURATION: 8 weeks  PLANNED INTERVENTIONS: 97164- PT Re-evaluation, 97110-Therapeutic exercises, 97530- Therapeutic activity, 97112- Neuromuscular re-education, 97535- Self Care, 16109- Manual therapy, Patient/Family education, Balance training, Taping, Cryotherapy, and Moist heat  PLAN FOR NEXT SESSION: check HEP, hip stability/closed chain if able    For all possible CPT codes, reference the Planned Interventions line above.     Check all conditions that are expected to impact treatment: {Conditions expected to impact treatment:None of these apply   If treatment provided at initial evaluation, no treatment charged due to lack of authorization.       Zameer Borman, PT 02/19/2024, 1:25 PM    Karie Mainland, PT 02/19/24 1:25 PM Phone: [phone] Fax: [phone]

## 2024-02-19 ENCOUNTER — Encounter (HOSPITAL_BASED_OUTPATIENT_CLINIC_OR_DEPARTMENT_OTHER): Payer: Self-pay | Admitting: *Deleted

## 2024-02-19 ENCOUNTER — Ambulatory Visit: Payer: Medicaid Other | Attending: Pediatrics | Admitting: Physical Therapy

## 2024-02-19 ENCOUNTER — Emergency Department (HOSPITAL_BASED_OUTPATIENT_CLINIC_OR_DEPARTMENT_OTHER): Payer: Medicaid Other

## 2024-02-19 ENCOUNTER — Other Ambulatory Visit: Payer: Self-pay

## 2024-02-19 ENCOUNTER — Emergency Department (HOSPITAL_BASED_OUTPATIENT_CLINIC_OR_DEPARTMENT_OTHER)
Admission: EM | Admit: 2024-02-19 | Discharge: 2024-02-19 | Disposition: A | Discharge status: Home / Self Care | Payer: Medicaid Other | Attending: Emergency Medicine | Admitting: Emergency Medicine

## 2024-02-19 DIAGNOSIS — G43909 Migraine, unspecified, not intractable, without status migrainosus: Secondary | ICD-10-CM | POA: Diagnosis not present

## 2024-02-19 DIAGNOSIS — R2990 Unspecified symptoms and signs involving the nervous system: Secondary | ICD-10-CM | POA: Insufficient documentation

## 2024-02-19 DIAGNOSIS — M25562 Pain in left knee: Secondary | ICD-10-CM | POA: Insufficient documentation

## 2024-02-19 DIAGNOSIS — R262 Difficulty in walking, not elsewhere classified: Secondary | ICD-10-CM

## 2024-02-19 DIAGNOSIS — I771 Stricture of artery: Secondary | ICD-10-CM | POA: Diagnosis not present

## 2024-02-19 DIAGNOSIS — G43109 Migraine with aura, not intractable, without status migrainosus: Secondary | ICD-10-CM | POA: Insufficient documentation

## 2024-02-19 DIAGNOSIS — R9431 Abnormal electrocardiogram [ECG] [EKG]: Secondary | ICD-10-CM | POA: Diagnosis not present

## 2024-02-19 DIAGNOSIS — R4182 Altered mental status, unspecified: Secondary | ICD-10-CM | POA: Diagnosis not present

## 2024-02-19 DIAGNOSIS — R519 Headache, unspecified: Secondary | ICD-10-CM | POA: Diagnosis not present

## 2024-02-19 DIAGNOSIS — M25561 Pain in right knee: Secondary | ICD-10-CM

## 2024-02-19 DIAGNOSIS — R29810 Facial weakness: Secondary | ICD-10-CM | POA: Diagnosis not present

## 2024-02-19 DIAGNOSIS — R200 Anesthesia of skin: Secondary | ICD-10-CM | POA: Diagnosis not present

## 2024-02-19 LAB — DIFFERENTIAL
Abs Immature Granulocytes: 0.01 10*3/uL (ref 0.00–0.07)
Basophils Absolute: 0.1 10*3/uL (ref 0.0–0.1)
Basophils Relative: 1 %
Eosinophils Absolute: 0.1 10*3/uL (ref 0.0–0.5)
Eosinophils Relative: 1 %
Immature Granulocytes: 0 %
Lymphocytes Relative: 27 %
Lymphs Abs: 1.9 10*3/uL (ref 0.7–4.0)
Monocytes Absolute: 0.8 10*3/uL (ref 0.1–1.0)
Monocytes Relative: 11 %
Neutro Abs: 4.3 10*3/uL (ref 1.7–7.7)
Neutrophils Relative %: 60 %

## 2024-02-19 LAB — CBC
HCT: 29.4 % — ABNORMAL LOW (ref 36.0–46.0)
Hemoglobin: 8.5 g/dL — ABNORMAL LOW (ref 12.0–15.0)
MCH: 16.5 pg — ABNORMAL LOW (ref 26.0–34.0)
MCHC: 28.9 g/dL — ABNORMAL LOW (ref 30.0–36.0)
MCV: 57.1 fL — ABNORMAL LOW (ref 80.0–100.0)
Platelets: 493 10*3/uL — ABNORMAL HIGH (ref 150–400)
RBC: 5.15 MIL/uL — ABNORMAL HIGH (ref 3.87–5.11)
RDW: 22.7 % — ABNORMAL HIGH (ref 11.5–15.5)
WBC: 7.1 10*3/uL (ref 4.0–10.5)
nRBC: 0 % (ref 0.0–0.2)

## 2024-02-19 LAB — COMPREHENSIVE METABOLIC PANEL
ALT: 7 U/L (ref 0–44)
AST: 12 U/L — ABNORMAL LOW (ref 15–41)
Albumin: 4.3 g/dL (ref 3.5–5.0)
Alkaline Phosphatase: 34 U/L — ABNORMAL LOW (ref 38–126)
Anion gap: 7 (ref 5–15)
BUN: 12 mg/dL (ref 6–20)
CO2: 22 mmol/L (ref 22–32)
Calcium: 9.5 mg/dL (ref 8.9–10.3)
Chloride: 109 mmol/L (ref 98–111)
Creatinine, Ser: 0.69 mg/dL (ref 0.44–1.00)
GFR, Estimated: >60 mL/min (ref >60)
Glucose, Bld: 111 mg/dL — ABNORMAL HIGH (ref 70–99)
Potassium: 3.8 mmol/L (ref 3.5–5.1)
Sodium: 138 mmol/L (ref 135–145)
Total Bilirubin: 0.7 mg/dL (ref 0.0–1.2)
Total Protein: 7.6 g/dL (ref 6.5–8.1)

## 2024-02-19 LAB — HCG, SERUM, QUALITATIVE: Preg, Serum: NEGATIVE

## 2024-02-19 LAB — PROTIME-INR
INR: 1 (ref 0.8–1.2)
Prothrombin Time: 13.8 s (ref 11.4–15.2)

## 2024-02-19 LAB — CBG MONITORING, ED: Glucose-Capillary: 106 mg/dL — ABNORMAL HIGH (ref 70–99)

## 2024-02-19 LAB — ETHANOL: Alcohol, Ethyl (B): <10 mg/dL (ref <10)

## 2024-02-19 LAB — APTT: aPTT: 27 s (ref 24–36)

## 2024-02-19 MED ORDER — DIPHENHYDRAMINE HCL 50 MG/ML IJ SOLN
25.0000 mg | Freq: Once | INTRAMUSCULAR | Status: AC
  Administered 2024-02-19: 25 mg via INTRAVENOUS
  Filled 2024-02-19: qty 1

## 2024-02-19 MED ORDER — IOHEXOL 350 MG/ML SOLN
75.0000 mL | Freq: Once | INTRAVENOUS | Status: AC | PRN
  Administered 2024-02-19: 75 mL via INTRAVENOUS

## 2024-02-19 MED ORDER — METOCLOPRAMIDE HCL 5 MG/ML IJ SOLN
10.0000 mg | Freq: Once | INTRAMUSCULAR | Status: AC
  Administered 2024-02-19: 10 mg via INTRAVENOUS
  Filled 2024-02-19: qty 2

## 2024-02-19 MED ORDER — KETOROLAC TROMETHAMINE 15 MG/ML IJ SOLN
15.0000 mg | Freq: Once | INTRAMUSCULAR | Status: AC
  Administered 2024-02-19: 15 mg via INTRAVENOUS
  Filled 2024-02-19: qty 1

## 2024-02-19 MED ORDER — METOCLOPRAMIDE HCL 10 MG PO TABS: 10.0000 mg | ORAL_TABLET | Freq: Four times a day (QID) | ORAL | 0 refills | Status: DC

## 2024-02-19 MED ORDER — SODIUM CHLORIDE 0.9% FLUSH: 3.0000 mL | Freq: Once | INTRAVENOUS | Status: DC

## 2024-02-19 NOTE — Discharge Instructions (Addendum)
 You were seen for your headache in the emergency department.  You were given medicines which improved your symptoms.  At home, please take Tylenol and ibuprofen for your headache.  You may also take the Reglan we have prescribed you for your headache or any nausea or vomiting that you have.  You may take this with Benadryl for severe headaches but please note that this will make you drowsy.    Check your MyChart online for the results of any tests that had not resulted by the time you left the emergency department.   Follow-up with your primary doctor in 2-3 days regarding your visit.  Follow-up with neurology as well.  Return immediately to the emergency department if you experience any of the following: Vision changes, numbness or weakness of your arms or legs, or any other concerning symptoms.    Thank you for visiting our Emergency Department. It was a pleasure taking care of you today.

## 2024-02-19 NOTE — Consult Note (Signed)
 [facility] Telemedicine Consult   Requesting Provider: Eloise Harman Consult Participants: Nurse Location of the provider: [facility] Location of the patient: [facility]  This consult was provided via telemedicine with 2-way video and audio communication. The patient/family was informed that care would be provided in this way and agreed to receive care in this manner.    Chief Complaint: Stuttering speech, left sided numbness, headache  HPI: 23 year old female with a history of asthma who reports that she laid down for a nap today due to having headache and feeling fatigued.  Awakened and noted numbness on the left side of her body and on the ride urgent care also noted difficulty with speech. Patient initially evaluated by urgent care and sent to [facility] for further evaluation.   Complains of bilateral knee pain   LKW: 1300 on 02/19/2024 TNK given?: No, Low likelihood of acute infarct, nondisabling disability IR Thrombectomy? No, Low likelihood of acute infarct,  nondisabling disability Modified Rankin Scale: 0-Completely asymptomatic and back to baseline post- stroke Time of teleneurologist evaluation: 1505  Exam: Vitals:   02/19/24 1501 02/19/24 1530  BP: (!) 144/72 (!) 146/75  Pulse: 97 100  Resp: 18   Temp: 97.7 F (36.5 C)   SpO2: 98% 100%    General: Patient tearful, jerking body, at times unable to hold head without jerking as if losing tone while in wheelchair  1A: Level of Consciousness - 0 1B: Ask Month and Age - 0 1C: 'Blink Eyes' & 'Squeeze Hands' - 0 2: Test Horizontal Extraocular Movements - 0 3: Test Visual Fields - 0 4: Test Facial Palsy - 0 5A: Test Left Arm Motor Drift - 0 5B: Test Right Arm Motor Drift - 0 6A: Test Left Leg Motor Drift - 0 6B: Test Right Leg Motor Drift - 0 7: Test Limb Ataxia - 0 8: Test Sensation - 1 9: Test Language/Aphasia- 0 10: Test Dysarthria - 1 (stuttering speech) 11: Test Extinction/Inattention - 0 NIHSS score:  2   Imaging Reviewed:  CLINICAL DATA:  Code stroke. 23 year old female last known normal 1300 hours. Altered mental status.   EXAM: CT HEAD WITHOUT CONTRAST   TECHNIQUE: Contiguous axial images were obtained from the base of the skull through the vertex without intravenous contrast.   RADIATION DOSE REDUCTION: This exam was performed according to the departmental dose-optimization program which includes automated exposure control, adjustment of the mA and/or kV according to patient size and/or use of iterative reconstruction technique.   COMPARISON:  None Available.   FINDINGS: Brain: Normal cerebral volume. No midline shift, ventriculomegaly, mass effect, evidence of mass lesion, intracranial hemorrhage or evidence of cortically based acute infarction. Gray-white matter differentiation is within normal limits throughout the brain. Normal basilar cisterns.   Vascular: No suspicious intracranial vascular hyperdensity.   Skull: Intact, negative.   Sinuses/Orbits: Visualized paranasal sinuses and mastoids are clear.   Other: No gaze deviation. Visualized orbits and scalp soft tissues are within normal limits.   ASPECTS [facility] Stroke Program Early CT Score)   Total score (0-10 with 10 being normal): 10   IMPRESSION: Normal noncontrast Head CT.  ASPECTS 10.  CLINICAL DATA:  23 year old female code stroke presentation, last known normal 1300 hours. Altered mental status. Right side headache. Left side numbness.   EXAM: CT ANGIOGRAPHY HEAD AND NECK   TECHNIQUE: Multidetector CT imaging of the head and neck was performed using the standard protocol during bolus administration of intravenous contrast. Multiplanar CT image reconstructions and MIPs were obtained to  evaluate the vascular anatomy. Carotid stenosis measurements (when applicable) are obtained utilizing NASCET criteria, using the distal internal carotid diameter as the denominator.   RADIATION DOSE  REDUCTION: This exam was performed according to the departmental dose-optimization program which includes automated exposure control, adjustment of the mA and/or kV according to patient size and/or use of iterative reconstruction technique.   CONTRAST:  75mL OMNIPAQUE IOHEXOL 350 MG/ML SOLN   COMPARISON:  Plain head CT 1515 hours today.   FINDINGS: CTA NECK   Skeleton: Negative.   Upper chest: Negative.   Other neck: Negative.   Aortic arch: Mild cardiac pulsation.  Normal 3 vessel arch.   Right carotid system: Normal.   Left carotid system: Normal.   Vertebral arteries: Proximal right subclavian artery and right vertebral artery origin are normal. Right vertebral artery is patent and normal to the skull base.   Proximal left subclavian artery and left vertebral artery origin are normal. Left vertebral artery appears slightly dominant and is mildly tortuous, patent to the skull base with no abnormality.   CTA HEAD   Posterior circulation: Normal distal vertebral arteries, vertebrobasilar junction, PICA origins. Patent basilar artery without stenosis. Patent SCA and PCA origins. Right posterior communicating artery is present, the left is diminutive or absent. Bilateral PCA branches are within normal limits.   Anterior circulation: Both ICA siphons are patent and normal. The left posterior communicating artery can be identified on sagittal image 121 of series 8. Both posterior communicating artery origins appear normal. Patent carotid termini. Normal MCA and ACA origins. Tortuous A1 segments. Anterior communicating artery, bilateral ACA branches are within normal limits. MCA M1 segments and MCA bi/trifurcations are patent without stenosis. Bilateral MCA branches are within normal limits.   Venous sinuses: Patent.   Anatomic variants: None.   Review of the MIP images confirms the above findings   IMPRESSION: Normal CTA Head and Neck.    Labs reviewed in epic  and pertinent values follow: CBG 85   Assessment: 23 year old female with a history of asthma who reports that she laid down for a nap today due to having headache and feeling fatigued.  Awakened and noted numbness on the left side of her body and on the ride urgent care also noted difficulty with speech. Patient initially evaluated by urgent care and sent to [facility] for further evaluation.  NIHSS of 2.  Low likelihood of acute infarct based on examination.  Head CT and CTA of the head personally reviewed.  No evidence of acute changes on head CT.  CTA shows no evidence of LVO.  Symptoms may very well be related to migraine.  Patient reports headache is right sided and associated with some nausea, photophobia and phonophobia.     Recommendations:  Migraine cocktail If no resolution of symptoms with migraine cocktail patient to have MRI of the brain without contrast.  If resolution of symptoms patient may continue follow up on an outpatient basis and have MRI non urgently for work up of migraine since patient without history of migraine.      This patient is receiving care for possible acute neurological changes. There was 45 minutes of care by this provider at the time of service, including time for direct evaluation via telemedicine, review of medical records, imaging studies and discussion of findings with providers, the patient and/or family.  Thana Farr, MD Neurology   If 8pm- 8am, please page neurology on call as listed in AMION.

## 2024-02-19 NOTE — ED Notes (Signed)
 Pt clarifies that she had a HA and laid down at 1300 and woke up at 13:20 with left sided numbness and then at 13:20 she had difficulty with her speech.

## 2024-02-19 NOTE — ED Notes (Signed)
 Pt stated H/A is much better. Pt stated she feels much better. Pt was able to get up use the bathroom without assistance. Pt requested some water and was given same.

## 2024-02-19 NOTE — ED Notes (Signed)
 Pt to CT, followed up with lab regarding running blood.

## 2024-02-19 NOTE — ED Provider Notes (Signed)
 [facility] AT [facility] Provider Note   CSN: [id number] Arrival date & time: 02/19/24  1457     History  Chief Complaint  Patient presents with   Code Stroke    Michelle Lynn is a 23 y.o. female.  23 year old female with a history of chronic knee pain who presents to the emergency department with headache and numbness.  Patient reports that this morning she woke up and had a headache on her right side.  Says it was sudden in onset.  Has difficulty characterizing.  Says it is severe.  At 1:30 PM today she started experiencing numbness on the entire left side of her body.  Also says that she had some difficulty speaking.       Home Medications Prior to Admission medications   Medication Sig Start Date End Date Taking? Authorizing Provider  metoCLOPramide (REGLAN) 10 MG tablet Take 1 tablet (10 mg total) by mouth every 6 (six) hours. 02/19/24  Yes Rondel Baton, MD  albuterol [facility] HFA) 108 (90 Base) MCG/ACT inhaler INHALE 2 PUFFS BY MOUTH EVERY 4 HOURS BEFPRE EXERCISE AS NEEDED FOR WHEEZING 02/10/24   Kalman Jewels, MD  clotrimazole (LOTRIMIN) 1 % cream Apply 1 Application topically 2 (two) times daily. For yeast skin rash Patient not taking: Reported on 02/19/2024 12/23/22   Ettefagh, Aron Baba, MD  EPINEPHrine 0.3 mg/0.3 mL IJ SOAJ injection INJECT INTO MUSCLE AS A SINGLE DOSE, NOTIFY 911 IF USED 02/10/24   Kalman Jewels, MD  ferrous sulfate 325 (65 FE) MG tablet Take 1 tablet (325 mg total) by mouth daily with breakfast. 02/10/24   Kalman Jewels, MD  fluconazole (DIFLUCAN) 150 MG tablet Take 1 tablet by mouth after completing antibiotic course for UTI Patient not taking: Reported on 02/19/2024 06/18/22   Darral Dash, DO  fluticasone (FLOVENT HFA) 220 MCG/ACT inhaler Inhale 2 puffs into the lungs 2 (two) times daily. 02/10/24   Kalman Jewels, MD  meloxicam (MOBIC) 15 MG tablet Take 1 tablet daily with food for 7 days. Then take as  needed. Patient not taking: Reported on 12/23/2022 08/13/21   Ralene Cork, DO  triamcinolone ointment (KENALOG) 0.1 % Apply to dry, itchy rash BID for up to 2 weeks at a time 12/23/22   Ettefagh, Aron Baba, MD      Allergies    Other and Pineapple    Review of Systems   Review of Systems  Physical Exam Updated Vital Signs BP 128/78 (BP Location: Right Arm)   Pulse 84   Temp 98 F (36.7 C) (Oral)   Resp 20   LMP 01/06/2024   SpO2 100%  Physical Exam Vitals and nursing note reviewed.  Constitutional:      General: She is not in acute distress.    Appearance: She is well-developed.  HENT:     Head: Normocephalic and atraumatic.     Right Ear: External ear normal.     Left Ear: External ear normal.     Nose: Nose normal.  Eyes:     Extraocular Movements: Extraocular movements intact.     Conjunctiva/sclera: Conjunctivae normal.     Pupils: Pupils are equal, round, and reactive to light.  Cardiovascular:     Rate and Rhythm: Normal rate and regular rhythm.     Heart sounds: No murmur heard. Pulmonary:     Effort: Pulmonary effort is normal. No respiratory distress.     Breath sounds: Normal breath sounds.  Musculoskeletal:  Cervical back: Normal range of motion and neck supple.     Right lower leg: No edema.     Left lower leg: No edema.  Skin:    General: Skin is warm and dry.  Neurological:     Mental Status: She is alert.     Comments: NIHSS Exam  Level of Consciousness: Alert  LOC Questions: Answers Month and Age Correctly  LOC Commands: Opens and Closes Eyes and Hands on command  Best Gaze: Horizontal ocular movements intact  Visual Fields: No visual field loss  Facial Palsy: None  L Upper Extremity Motor: No drift after 10 seconds  R Upper Extremity Motor: No drift after 10 seconds  L Lower extremity Motor: No drift after 5 seconds  R Lower extremity Motor: No drift after 5 seconds  Ataxia: Absent  Sensory: Diminished sensation to light touch in the  left half of the face, left arm, and left lower extremity Best Language: No aphasia  Dysarthria: Stuttering speech Neglect: No visual or sensory neglect    Psychiatric:        Mood and Affect: Mood normal.     ED Results / Procedures / Treatments   Labs (all labs ordered are listed, but only abnormal results are displayed) Labs Reviewed  CBC - Abnormal; Notable for the following components:      Result Value   RBC 5.15 (*)    Hemoglobin 8.5 (*)    HCT 29.4 (*)    MCV 57.1 (*)    MCH 16.5 (*)    MCHC 28.9 (*)    RDW 22.7 (*)    Platelets 493 (*)    All other components within normal limits  COMPREHENSIVE METABOLIC PANEL - Abnormal; Notable for the following components:   Glucose, Bld 111 (*)    AST 12 (*)    Alkaline Phosphatase 34 (*)    All other components within normal limits  CBG MONITORING, ED - Abnormal; Notable for the following components:   Glucose-Capillary 106 (*)    All other components within normal limits  PROTIME-INR  APTT  DIFFERENTIAL  ETHANOL  HCG, SERUM, QUALITATIVE  CBG MONITORING, ED    EKG EKG Interpretation Date/Time:  Friday February 19 2024 15:33:26 EST Ventricular Rate:  89 PR Interval:  152 QRS Duration:  76 QT Interval:  312 QTC Calculation: 380 R Axis:   77  Text Interpretation: Sinus rhythm Nonspecific T abnormalities, diffuse leads Confirmed by Linwood Dibbles [phone]) on 02/20/2024 2:31:16 PM  Radiology CT ANGIO HEAD NECK W WO CM Result Date: 02/19/2024 CLINICAL DATA:  24 year old female code stroke presentation, last known normal 1300 hours. Altered mental status. Right side headache. Left side numbness. EXAM: CT ANGIOGRAPHY HEAD AND NECK TECHNIQUE: Multidetector CT imaging of the head and neck was performed using the standard protocol during bolus administration of intravenous contrast. Multiplanar CT image reconstructions and MIPs were obtained to evaluate the vascular anatomy. Carotid stenosis measurements (when applicable) are  obtained utilizing NASCET criteria, using the distal internal carotid diameter as the denominator. RADIATION DOSE REDUCTION: This exam was performed according to the departmental dose-optimization program which includes automated exposure control, adjustment of the mA and/or kV according to patient size and/or use of iterative reconstruction technique. CONTRAST:  75mL OMNIPAQUE IOHEXOL 350 MG/ML SOLN COMPARISON:  Plain head CT 1515 hours today. FINDINGS: CTA NECK Skeleton: Negative. Upper chest: Negative. Other neck: Negative. Aortic arch: Mild cardiac pulsation.  Normal 3 vessel arch. Right carotid system: Normal. Left carotid system: Normal.  Vertebral arteries: Proximal right subclavian artery and right vertebral artery origin are normal. Right vertebral artery is patent and normal to the skull base. Proximal left subclavian artery and left vertebral artery origin are normal. Left vertebral artery appears slightly dominant and is mildly tortuous, patent to the skull base with no abnormality. CTA HEAD Posterior circulation: Normal distal vertebral arteries, vertebrobasilar junction, PICA origins. Patent basilar artery without stenosis. Patent SCA and PCA origins. Right posterior communicating artery is present, the left is diminutive or absent. Bilateral PCA branches are within normal limits. Anterior circulation: Both ICA siphons are patent and normal. The left posterior communicating artery can be identified on sagittal image 121 of series 8. Both posterior communicating artery origins appear normal. Patent carotid termini. Normal MCA and ACA origins. Tortuous A1 segments. Anterior communicating artery, bilateral ACA branches are within normal limits. MCA M1 segments and MCA bi/trifurcations are patent without stenosis. Bilateral MCA branches are within normal limits. Venous sinuses: Patent. Anatomic variants: None. Review of the MIP images confirms the above findings IMPRESSION: Normal CTA Head and Neck.  Electronically Signed   By: Odessa Fleming M.D.   On: 02/19/2024 15:36    Procedures Procedures    Medications Ordered in ED Medications  iohexol (OMNIPAQUE) 350 MG/ML injection 75 mL (75 mLs Intravenous Contrast Given 02/19/24 1515)  metoCLOPramide (REGLAN) injection 10 mg (10 mg Intravenous Given 02/19/24 1539)  diphenhydrAMINE (BENADRYL) injection 25 mg (25 mg Intravenous Given 02/19/24 1539)  ketorolac (TORADOL) 15 MG/ML injection 15 mg (15 mg Intravenous Given 02/19/24 1853)    ED Course/ Medical Decision Making/ A&P Clinical Course as of 02/21/24 1515  Fri Feb 19, 2024  1638 Reassessed.  Patient reports that her numbness is resolving.  Speech seems to be improving as well. [RP]  1844 Dr Thad Ranger from neurology feels that patient has a complicated migraine.  Recommends cocktail be given if her symptoms do not improve considering MRI. [RP]    Clinical Course User Index [RP] Rondel Baton, MD                                 Medical Decision Making Amount and/or Complexity of Data Reviewed Labs: ordered. Radiology: ordered.  Risk Prescription drug management.   Michelle Lynn is a 23 y.o. female with comorbidities that complicate the patient evaluation including chronic knee pain who presents to the emergency department with headache and numbness.    Initial Ddx:  Stroke, ICH, hypoglycemia, meningitis/infection, complicated migraine  MDM:  Concerned about a stroke given the patient's symptoms.  Patient is protecting airway at this time.  Is within the window for tPA so we will activate code stroke.  There are some features of her presentation that indicate possible etiology aside from stroke such as the stuttering which may not represent true dysarthria or aphasia.  Her symptoms are also subjective otherwise.  Will check a CBG to ensure that the patient is not hypoglycemic and a CT of the head to evaluate for ICH.  No signs of infection currently on history or exam to suggest  an infectious cause of their symptoms.  Plan:  CBG Labs Code stroke activation CT head Migraine cocktail  ED Summary/Re-evaluation:  CT and CT angio were obtained which did not show acute abnormality such as ICH or dissection.  Evaluated by neurology who felt that the patient had a complicated migraine.  She was given migraine cocktail and as her headache  resolved so that her neurologic symptoms.  Agree that this is the likely cause of her symptoms.  Neurology did not feel that she needed an MRI.  Patient was feeling much better after her migraine cocktail and was instructed to follow-up with her primary doctor and neurology as an outpatient.  This patient presents to the ED for concern of complaints listed in HPI, this involves an extensive number of treatment options, and is a complaint that carries with it a high risk of complications and morbidity. Disposition including potential need for admission considered.   Dispo: DC Home. Return precautions discussed including, but not limited to, those listed in the AVS. Allowed pt time to ask questions which were answered fully prior to dc.  Additional history obtained from  sister Records reviewed Outpatient Clinic Notes The following labs were independently interpreted: Chemistry and show no acute abnormality I independently reviewed the following imaging with scope of interpretation limited to determining acute life threatening conditions related to emergency care: CT Head and agree with the radiologist interpretation with the following exceptions: none I personally reviewed and interpreted cardiac monitoring: normal sinus rhythm  I personally reviewed and interpreted the pt's EKG: see above for interpretation  I have reviewed the patients home medications and made adjustments as needed Consults: Neurology   CRITICAL CARE Performed by: Rondel Baton   Total critical care time: 30 minutes  Critical care time was exclusive of  separately billable procedures and treating other patients.  Critical care was necessary to treat or prevent imminent or life-threatening deterioration.  Critical care was time spent personally by me on the following activities: development of treatment plan with patient and/or surrogate as well as nursing, discussions with consultants, evaluation of patient's response to treatment, examination of patient, obtaining history from patient or surrogate, ordering and performing treatments and interventions, ordering and review of laboratory studies, ordering and review of radiographic studies, pulse oximetry and re-evaluation of patient's condition.   Final Clinical Impression(s) / ED Diagnoses Final diagnoses:  Complicated migraine  Stroke-like symptoms    Rx / DC Orders ED Discharge Orders          Ordered    Ambulatory referral to Neurology       Comments: An appointment is requested in approximately: 1 week   02/19/24 1846    metoCLOPramide (REGLAN) 10 MG tablet  Every 6 hours        02/19/24 1846              Rondel Baton, MD 02/21/24 1515

## 2024-02-19 NOTE — Progress Notes (Signed)
 [facility] RN - Code Stroke Note  1458- code stroke activation [phone]- Neuro paged 1505- Dr Thad Ranger on camera, Pt being taken to CT 1522- Pt returned  NIH 2 MRS 0

## 2024-02-19 NOTE — ED Triage Notes (Signed)
 At 1:20 pt could not feel the left side of her body.  At 1:30 she began having difficulty speaking (studderting).  EDP seeing pt in triage.  Pt having some twitching.  Speech appears to be improving during triage

## 2024-02-22 ENCOUNTER — Encounter: Payer: Self-pay | Admitting: Neurology

## 2024-02-27 DIAGNOSIS — H5213 Myopia, bilateral: Secondary | ICD-10-CM | POA: Diagnosis not present

## 2024-03-03 NOTE — Therapy (Unsigned)
 OUTPATIENT PHYSICAL THERAPY LOWER EXTREMITY NOTE   Patient Name: Michelle Lynn MRN: [id number] DOB:[birth year], 23 y.o., female Today's Date: 03/04/2024  END OF SESSION:  PT End of Session - 03/04/24 0854     Visit Number 2    Number of Visits 8    Date for PT Re-Evaluation 04/15/24    Authorization Type [place] [facility] [employer]    PT Start Time [phone]    PT Stop Time 0930    PT Time Calculation (min) 38 min    Activity Tolerance Patient tolerated treatment well    Behavior During Therapy [facility] for tasks assessed/performed              Past Medical History:  Diagnosis Date   Allergy    Asthma    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 No pertinent surgical history. 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  Knee pain L >R 5/10   02/19/24 ED visit: ED Summary/Re-evaluation:  CT and CT angio were obtained which did not show acute abnormality such as ICH or dissection.  Evaluated by neurology who felt that the patient had a complicated migraine.   She noticed knee pain about 2 mos ago when she started taking classes at [employer] [facility] [employer].   She used to be more active and she thinks it may because of her lack of exercise.  She played basketball, track in HS.  Prior to this injury she did workout at the gym, running  on track.   She has pain, stiffness and difficulty getting up and down stairs.  She has used braces but that did not really help. She has not had PT before.   PERTINENT HISTORY: Asthma  PAIN:  Are you having pain? Yes: NPRS scale: 5/10 Pain location: anterior knees Pain description: stiffness, pain, aching  Aggravating factors: weightbearing , bending knees  Relieving factors: rest  PRECAUTIONS: None  RED FLAGS: None   WEIGHT BEARING RESTRICTIONS: No  FALLS:  Has patient fallen in last 6 months? No has recently had to use the rails due to feeling off balance   LIVING ENVIRONMENT: Lives with: lives with their family Lives in: House/apartment Stairs: Yes: Internal: 12 steps; on right going up Has following equipment at home: None  OCCUPATION: Consulting civil engineer at [employer]    PLOF: Independent, Vocation/Vocational requirements: walking and getting around school, Leisure: friends, and painting  PATIENT GOALS: Improve her knee pain, improve squatting   NEXT MD VISIT: as needed   OBJECTIVE:  Note: Objective measures were completed at Evaluation unless otherwise noted.  DIAGNOSTIC FINDINGS: none  PATIENT SURVEYS:  LEFS 33/80  COGNITION: Overall cognitive status: Within functional limits for tasks assessed     SENSATION: WFL  EDEMA:   POSTURE:  genu recurvatum , min genu valgus    PALPATION: Min pain bilateral distal to patella and laterally  LOWER EXTREMITY ROM:  Active ROM Right eval Left eval  Hip flexion    Hip extension    Hip abduction    Hip adduction    Hip internal rotation    Hip external rotation    Knee flexion 132 133  Knee extension +5 +5  Ankle dorsiflexion    Ankle plantarflexion    Ankle inversion    Ankle eversion     (Blank rows = not tested)  LOWER EXTREMITY MMT:  MMT Right eval Left eval  Hip flexion    Hip extension 4 4  Hip abduction 4 4  Hip adduction    Hip internal rotation    Hip external rotation    Knee flexion 5 5  Knee  extension 5 5  Ankle dorsiflexion    Ankle plantarflexion    Ankle inversion    Ankle eversion     (Blank rows = not tested)  LOWER EXTREMITY SPECIAL TESTS:  Hip special tests: Trendelenburg test: negative  FUNCTIONAL TESTS:  5 times sit to stand: 21.4 sec Single leg bridge 15 sec, mod diff Rt LE   GAIT: Distance walked:150  Assistive device utilized: None Level of assistance: Modified independence Comments: slow to transfer to standing, no limp, improved velocity after session                                                                                                                                 TREATMENT DATE:   [facility] Adult PT Treatment:                                                DATE: 03/04/24 Therapeutic Activity: NuStep L 6 UE and LE 6 min  Slantboard calf stretch Hip abduction standing x 10  Reverse Step up x 10, cues needed for standing knee control and coordination  QS to SLR flexion x 15  VMO SLR x 15  Ball squeeze x 10 (adduction) Partial Bridge x 10 with ball  S/L hip abduction x 10 x 2  Clam green band x 15  Manual: McConnell tape to each knee, pulling patella laterally.   [facility] Adult PT Treatment:                                                DATE: 02/19/24  Therapeutic Activity: SLR, hip abd and flexion  Step ups  Single leg balance  Self Care: Discussed LE alignment and knee position      PATIENT EDUCATION:  Education details: self care, eval findings, POC, causes of knee pain  Person educated: Patient Education method: Explanation, Demonstration, and Handouts Education comprehension: verbalized understanding, returned demonstration, and needs  further education  HOME EXERCISE PROGRAM: Access Code: [email or web address] URL: [email or web address] Date: 02/19/2024 Prepared by: Karie Mainland  Exercises - Single Leg Stance  - 1 x daily - 7 x weekly - 1 sets - 5 reps - 30 hold - Sidelying Hip Abduction  - 1 x daily - 7 x weekly - 2  sets - 10 reps - 5 hold - Active Straight Leg Raise with Quad Set  - 1 x daily - 7 x weekly - 2 sets - 10 reps - 5 hold - Straight Leg Raise with External Rotation  - 1 x daily - 7 x weekly - 2 sets - 10 reps - 5 hold  ASSESSMENT:  CLINICAL IMPRESSION: Patient tolerated the session well, she has notable weakness with SLR and some lumbopelvic stability as well. Taped patella with McConnell tape and noticed an immediate difference in pain when standing.  She will cont to benefit from skilled PT to improve her ability to walk and negotiate stairs with less pain and overall.  Patient is a 23 y.o. female who was seen today for physical therapy evaluation and treatment for bilateral knee pain, consistent with patellofemoral syndrome.  She has a notable trunk lean with step ups and compensates for hip weakness.    OBJECTIVE IMPAIRMENTS: Abnormal gait, decreased activity tolerance, decreased knowledge of use of DME, decreased mobility, difficulty walking, decreased ROM, decreased strength, increased fascial restrictions, and pain.   ACTIVITY LIMITATIONS: standing, squatting, stairs, transfers, and locomotion level  PARTICIPATION LIMITATIONS: interpersonal relationship, community activity, occupation, and school  PERSONAL FACTORS: 1 comorbidity: asthma  are also affecting patient's functional outcome.   REHAB POTENTIAL: Excellent  CLINICAL DECISION MAKING: Stable/uncomplicated  EVALUATION COMPLEXITY: Low   GOALS: Goals reviewed with patient? Yes  SHORT TERM GOALS: Target date: 03/04/2024   Pt will be I with HEP for hip and knee strength Baseline:unknown  Goal status: INITIAL  2.  Pt will be able to notice reduced stiffness when getting up in the AM, doing stairs, improved 25%  Baseline: mod Goal status: INITIAL   LONG TERM GOALS: Target date: 04/15/2024    Pt will be I with HEP for hip and knee strength, ROM  Baseline: unknown  Goal status: INITIAL  2.  Pt will improve sit to  stand x 5 time to < 13 sec  Baseline: 21.4 sec  Goal status: INITIAL  3.  Pt will be able to balance on each LE in SLS for 30 sec  Baseline: 10 sec  Goal status: INITIAL  4.  Pt will be able to walk without increased knee pain for 30 min in the community Baseline: limited due to pain  Goal status: INITIAL  5.  LEFS score will improve to 45/80 at the end of the POC.  Baseline: 33/80 Goal status: INITIAL    PLAN:  PT FREQUENCY: 1x/week  PT DURATION: 8 weeks  PLANNED INTERVENTIONS: 97164- PT Re-evaluation, 97110-Therapeutic exercises, 97530- Therapeutic activity, 97112- Neuromuscular re-education, 97535- Self Care, 16109- Manual therapy, Patient/Family education, Balance training, Taping, Cryotherapy, and Moist heat  PLAN FOR NEXT SESSION: check HEP, hip stability/closed chain if able    For all possible CPT codes, reference the Planned Interventions line above.     Check all conditions that are expected to impact treatment: {Conditions expected to impact treatment:None of these apply   If treatment provided at initial evaluation, no treatment charged due to lack of authorization.       Estel Tonelli, PT 03/04/2024, 11:49 AM   Karie Mainland,  PT 03/04/24 11:49 AM Phone: [phone] Fax: [phone]

## 2024-03-04 ENCOUNTER — Ambulatory Visit: Payer: Medicaid Other | Attending: Pediatrics | Admitting: Physical Therapy

## 2024-03-04 ENCOUNTER — Encounter: Payer: Self-pay | Admitting: Physical Therapy

## 2024-03-04 DIAGNOSIS — M25562 Pain in left knee: Secondary | ICD-10-CM | POA: Insufficient documentation

## 2024-03-04 DIAGNOSIS — M25561 Pain in right knee: Secondary | ICD-10-CM | POA: Diagnosis not present

## 2024-03-04 DIAGNOSIS — R262 Difficulty in walking, not elsewhere classified: Secondary | ICD-10-CM | POA: Insufficient documentation

## 2024-03-10 NOTE — Progress Notes (Deleted)
 NEUROLOGY CONSULTATION NOTE  Michelle Lynn MRN: [id number] DOB: Jun 25, 2001  Referring provider: Vonita Moss, MD (ED referral) Primary care provider: Kalman Jewels, MD  Reason for consult:  complicated migraine  Assessment/Plan:   ***   Subjective:  Michelle Lynn is a 23 year old ***-handed female who presents for complicated migraine.  History supplemented by ED note.  On 02/19/2024, she woke up that morning with ***.  She presented to the ED at [facility] as a code stroke.  CT head and CTA head and neck were normal.  EKG and labs, including CBG, were unremarkable.  She was diagnosed with complicated migraine and treated with a headache cocktail of Toradol 15mg , Reglan 10mg  and Benadryl 25mg .  ***  Past NSAIDS/analgesics:  meloxicam Past abortive triptans:  *** Past abortive ergotamine:  none Past muscle relaxants:  none Past anti-emetic:  *** Past antihypertensive medications:  *** Past antidepressant medications:  *** Past anticonvulsant medications:  *** Past anti-CGRP:  *** Past vitamins/Herbal/Supplements:  none Past antihistamines/decongestants:  Benadryl Other past therapies:  ***  Current NSAIDS/analgesics:  *** Current triptans:  none Current ergotamine:  none Current anti-emetic:  Reglan 10mg  Current muscle relaxants:  none Current Antihypertensive medications:  none Current Antidepressant medications:  none Current Anticonvulsant medications:  none Current anti-CGRP:  none Current Vitamins/Herbal/Supplements:  none Current Antihistamines/Decongestants:  none Other therapy:  *** Birth control:  ***   Caffeine:  *** Alcohol:  *** Smoker:  *** Diet:  *** Exercise:  *** Depression:  ***; Anxiety:  *** Other pain:  *** Sleep hygiene:  *** Family history of headache:  ***      PAST MEDICAL HISTORY: Past Medical History:  Diagnosis Date   Allergy    Asthma    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  MEDICATIONS: Current Outpatient Medications on File Prior to Visit  Medication Sig Dispense Refill   albuterol (PROAIR HFA) 108 (90 Base) MCG/ACT inhaler INHALE 2 PUFFS BY MOUTH EVERY 4 HOURS BEFPRE EXERCISE AS NEEDED FOR WHEEZING 17 g 1   clotrimazole (LOTRIMIN) 1 % cream Apply 1 Application topically 2 (two) times daily. For yeast skin rash (Patient not taking: Reported on 02/19/2024) 30 g 1   EPINEPHrine 0.3 mg/0.3 mL IJ SOAJ injection INJECT INTO MUSCLE AS A SINGLE DOSE, NOTIFY 911 IF USED 2 each 1   ferrous sulfate 325 (65 FE) MG tablet Take 1 tablet (325 mg total) by mouth daily with breakfast. 30 tablet 3   fluconazole (DIFLUCAN) 150 MG tablet Take 1 tablet by mouth after completing antibiotic course for UTI (Patient not taking: Reported on 02/19/2024) 1 tablet 0   fluticasone (FLOVENT HFA) 220 MCG/ACT inhaler Inhale 2 puffs into the lungs 2 (two) times daily. 1 each 12   meloxicam (MOBIC) 15 MG tablet Take 1 tablet daily with food for 7 days. Then take as needed. (Patient not taking: Reported on 12/23/2022) 40 tablet 0   metoCLOPramide (REGLAN) 10 MG tablet Take 1 tablet (10 mg total) by mouth every 6 (six) hours. 10 tablet 0   triamcinolone ointment (KENALOG) 0.1 % Apply to dry, itchy rash BID for up to 2 weeks at a time 80 g 1   No current facility-administered medications on file prior to visit.    ALLERGIES: Allergies  Allergen Reactions   Other Anaphylaxis, Shortness Of Breath and Swelling   Pineapple Swelling    Mother  reports has had pineapple since then with no reaction    FAMILY HISTORY: Family History  Problem Relation Age of Onset   Hypertension Mother    Depression Mother    Hypertension Maternal Grandmother    Diabetes Maternal Grandmother    Depression Maternal Grandmother    Stroke Maternal Grandmother    Diabetes Paternal Grandmother     Objective:  *** General: No acute  distress.  Patient appears well-groomed.   Head:  Normocephalic/atraumatic Eyes:  fundi examined but not visualized Neck: supple, no paraspinal tenderness, full range of motion Heart: regular rate and rhythm Vascular: No carotid bruits. Neurological Exam: Mental status: alert and oriented to person, place, and time, speech fluent and not dysarthric, language intact. Cranial nerves: CN I: not tested CN II: pupils equal, round and reactive to light, visual fields intact CN III, IV, VI:  full range of motion, no nystagmus, no ptosis CN V: facial sensation intact. CN VII: upper and lower face symmetric CN VIII: hearing intact CN IX, X: gag intact, uvula midline CN XI: sternocleidomastoid and trapezius muscles intact CN XII: tongue midline Bulk & Tone: normal, no fasciculations. Motor:  muscle strength 5/5 throughout Sensation:  Pinprick and vibratory sensation intact. Deep Tendon Reflexes:  2+ throughout,  toes downgoing.   Finger to nose testing:  Without dysmetria.    Gait:  Normal station and stride.  Romberg negative.    Thank you for allowing me to take part in the care of this patient.  Shon Millet, DO  CC: Kalman Jewels, MD

## 2024-03-10 NOTE — Therapy (Signed)
 OUTPATIENT PHYSICAL THERAPY LOWER EXTREMITY NOTE   Patient Name: Michelle Lynn MRN: [id number] DOB:October 09, 2001, 23 y.o., female Today's Date: 03/11/2024  END OF SESSION:  PT End of Session - 03/11/24 1334     Visit Number 3    Number of Visits 8    Date for PT Re-Evaluation 04/15/24    Authorization Type [place] [employer]    PT Start Time 1325    PT Stop Time 1405    PT Time Calculation (min) 40 min    Activity Tolerance Patient tolerated treatment well    Behavior During Therapy [facility] for tasks assessed/performed               Past Medical History:  Diagnosis Date   Allergy    Asthma    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 No pertinent surgical history. 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 She has completed her HEP 4x.  02/19/24 ED visit: ED Summary/Re-evaluation:  CT and CT angio were obtained which did not show acute abnormality such as ICH or dissection.  Evaluated by neurology who felt that the patient had a complicated migraine.   She noticed knee pain about 2 mos ago when she started taking classes at [employer].   She used to be more active and she thinks it may because of her lack of exercise.  She played basketball, track in HS.  Prior to this injury she did workout at the  gym, running on track.   She has pain, stiffness and difficulty getting up and down stairs.  She has used braces but that did not really help. She has not had PT before.   PERTINENT HISTORY: Asthma  PAIN:  Are you having pain? Yes: NPRS scale: 3/10 for L, 0/10 for R Pain location: anterior knees Pain description: stiffness, pain, aching  Aggravating factors: weightbearing , bending knees  Relieving factors: rest  PRECAUTIONS: None  RED FLAGS: None   WEIGHT BEARING RESTRICTIONS: No  FALLS:  Has patient fallen in last 6 months? No has recently had to use the rails due to feeling off balance   LIVING ENVIRONMENT: Lives with: lives with their family Lives in: House/apartment Stairs: Yes: Internal: 12 steps; on right going up Has following equipment at home: None  OCCUPATION: Consulting civil engineer at [employer]    PLOF: Independent, Vocation/Vocational requirements: walking and getting around school, Leisure: friends, and painting  PATIENT GOALS: Improve her knee pain, improve squatting   NEXT MD VISIT: as needed   OBJECTIVE:  Note: Objective measures were completed at Evaluation unless otherwise noted.  DIAGNOSTIC FINDINGS: none  PATIENT SURVEYS:  LEFS 33/80  COGNITION: Overall cognitive status: Within functional limits for tasks assessed     SENSATION: WFL  EDEMA:   POSTURE:  genu recurvatum , min genu valgus    PALPATION: Min pain bilateral  distal to patella and laterally   LOWER EXTREMITY ROM:  Active ROM Right eval Left eval  Hip flexion    Hip extension    Hip abduction    Hip adduction    Hip internal rotation    Hip external rotation    Knee flexion 132 133  Knee extension +5 +5  Ankle dorsiflexion    Ankle plantarflexion    Ankle inversion    Ankle eversion     (Blank rows = not tested)  LOWER EXTREMITY MMT:  MMT Right eval Left eval  Hip flexion    Hip extension 4 4  Hip abduction 4 4  Hip adduction    Hip internal rotation    Hip external  rotation    Knee flexion 5 5  Knee extension 5 5  Ankle dorsiflexion    Ankle plantarflexion    Ankle inversion    Ankle eversion     (Blank rows = not tested)  LOWER EXTREMITY SPECIAL TESTS:  Hip special tests: Trendelenburg test: negative  FUNCTIONAL TESTS:  5 times sit to stand: 21.4 sec Single leg bridge 15 sec, mod diff Rt LE   GAIT: Distance walked:150  Assistive device utilized: None Level of assistance: Modified independence Comments: slow to transfer to standing, no limp, improved velocity after session                                                                                                                                 TREATMENT DATE:  [facility] Adult PT Treatment:                                                DATE: 03/11/24 Therapeutic Activity: NuStep L6 UE and LE 6 min  Slantboard calf stretch Hip abduction standing x 10  Lateral step ups 4' x10 QS to SLR flexion x 15  VMO SLR x 15  Ball squeeze x 10 (adduction) Partial Bridge x 10 with ball  S/L hip abduction x 10 x 2  Clam green band x 15  Manual: McConnell tape to each knee, pulling patella laterally.   [facility] Adult PT Treatment:                                                DATE: 03/04/24 Therapeutic Activity: NuStep L 6 UE and LE 6 min  Slantboard calf stretch SL standing on airex, better balance on LE Hip abduction standing on airex x 15  Reverse Step up x 10  QS to SLR flexion x 15  VMO SLR x 15  Ball squeeze x 10 (adduction) Partial Bridge x 10 with ball  S/L hip abduction x 10  x 2  Clam green band x 15  Manual: McConnell tape to each knee, pulling patella laterally.   [facility] Adult PT Treatment:                                                DATE: 02/19/24  Therapeutic Activity: SLR, hip abd and flexion  Step ups  Single leg balance  Self Care: Discussed LE alignment and knee position      PATIENT EDUCATION:  Education details: self care, eval findings, POC, causes of knee pain   Person educated: Patient Education method: Explanation, Demonstration, and Handouts Education comprehension: verbalized understanding, returned demonstration, and needs further education  HOME EXERCISE PROGRAM: Access Code: [email or web address] URL: [email or web address] Date: 02/19/2024 Prepared by: Karie Mainland  Exercises - Single Leg Stance  - 1 x daily - 7 x weekly - 1 sets - 5 reps - 30 hold - Sidelying Hip Abduction  - 1 x daily - 7 x weekly - 2 sets - 10 reps - 5 hold - Active Straight Leg Raise with Quad Set  - 1 x daily - 7 x weekly - 2 sets - 10 reps - 5 hold - Straight Leg Raise with External Rotation  - 1 x daily - 7 x weekly - 2 sets - 10 reps - 5 hold  ASSESSMENT:  CLINICAL IMPRESSION: Pt reports to PT reporting improved knee pain bilat and that she has been completing her HEP. PT was continued for LE strengthening. Airex was added for increased challenge with standing activities. Pt reports better strength, balance, and less ain with her R LE. Pt found relief with McConnell taping the last PT session, so it was continued today. Pt is responding positively to PT intervention. She will cont to benefit from skilled PT to improve her ability to walk and negotiate stairs with less pain and overall.  Patient is a 23 y.o. female who was seen today for physical therapy evaluation and treatment for bilateral knee pain, consistent with patellofemoral syndrome.  She has a notable trunk lean with step ups and compensates for hip weakness.    OBJECTIVE IMPAIRMENTS: Abnormal gait, decreased activity tolerance, decreased knowledge of use of DME, decreased mobility, difficulty walking, decreased ROM, decreased strength, increased fascial restrictions, and pain.   ACTIVITY LIMITATIONS: standing, squatting, stairs, transfers, and locomotion level  PARTICIPATION LIMITATIONS: interpersonal relationship, community activity, occupation, and school  PERSONAL FACTORS: 1 comorbidity: asthma   are also affecting patient's functional outcome.   REHAB POTENTIAL: Excellent  CLINICAL DECISION MAKING: Stable/uncomplicated  EVALUATION COMPLEXITY: Low   GOALS: Goals reviewed with patient? Yes  SHORT TERM GOALS: Target date: 03/04/2024   Pt will be I with HEP for hip and knee strength Baseline:unknown  Goal status: INITIAL  2.  Pt will be able to notice reduced stiffness when getting up in the AM, doing stairs, improved 25%  Baseline: mod Goal status: INITIAL   LONG TERM GOALS: Target date: 04/15/2024    Pt will be I with HEP for hip and knee strength, ROM  Baseline: unknown  Goal status: INITIAL  2.  Pt will improve sit to stand x 5 time to < 13 sec  Baseline: 21.4 sec  Goal status: INITIAL  3.  Pt will be able to balance on each LE in SLS for 30 sec  Baseline: 10 sec  Goal status: INITIAL  4.  Pt will be able to walk without increased knee pain for 30 min in the community Baseline: limited due to pain  Goal status: INITIAL  5.  LEFS score will improve to 45/80 at the end of the POC.  Baseline: 33/80 Goal status: INITIAL    PLAN:  PT FREQUENCY: 1x/week  PT DURATION: 8 weeks  PLANNED INTERVENTIONS: 97164- PT Re-evaluation, 97110-Therapeutic exercises, 97530- Therapeutic activity, 97112- Neuromuscular re-education, 97535- Self Care, 91478- Manual therapy, Patient/Family education, Balance training, Taping, Cryotherapy, and Moist heat     Syd Newsome MS, PT 03/11/24 2:20 PM

## 2024-03-11 ENCOUNTER — Ambulatory Visit: Payer: Medicaid Other

## 2024-03-11 ENCOUNTER — Ambulatory Visit: Admitting: Neurology

## 2024-03-11 DIAGNOSIS — R262 Difficulty in walking, not elsewhere classified: Secondary | ICD-10-CM | POA: Diagnosis not present

## 2024-03-11 DIAGNOSIS — M25562 Pain in left knee: Secondary | ICD-10-CM | POA: Diagnosis not present

## 2024-03-11 DIAGNOSIS — M25561 Pain in right knee: Secondary | ICD-10-CM | POA: Diagnosis not present

## 2024-03-17 NOTE — Therapy (Signed)
 OUTPATIENT PHYSICAL THERAPY LOWER EXTREMITY NOTE   Patient Name: Michelle Lynn MRN: [id number] DOB:07-13-2001, 23 y.o., female Today's Date: 03/18/2024  END OF SESSION:  PT End of Session - 03/18/24 1416     Visit Number 4    Number of Visits 8    Date for PT Re-Evaluation 04/15/24    Authorization Type [place] [employer]    PT Start Time 1321    PT Stop Time 1405    PT Time Calculation (min) 44 min    Activity Tolerance Patient tolerated treatment well    Behavior During Therapy [facility] for tasks assessed/performed                Past Medical History:  Diagnosis Date   Allergy    Asthma    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 No pertinent surgical history. 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 The R to where it is not hurting.  02/19/24 ED visit: ED Summary/Re-evaluation:  CT and CT angio were obtained which did not show acute abnormality such as ICH or dissection.  Evaluated by neurology who felt that the patient had a complicated migraine.   She noticed knee pain about 2 mos ago when she started taking classes at [employer].   She used to be more active and she thinks it may because of her lack of exercise.  She played basketball, track in HS.  Prior to this injury she did  workout at the gym, running on track.   She has pain, stiffness and difficulty getting up and down stairs.  She has used braces but that did not really help. She has not had PT before.   PERTINENT HISTORY: Asthma  PAIN:  Are you having pain? Yes: NPRS scale: 2/10 for L, 0/10 for R Pain location: anterior knees Pain description: stiffness, pain, aching  Aggravating factors: weightbearing , bending knees  Relieving factors: rest  PRECAUTIONS: None  RED FLAGS: None   WEIGHT BEARING RESTRICTIONS: No  FALLS:  Has patient fallen in last 6 months? No has recently had to use the rails due to feeling off balance   LIVING ENVIRONMENT: Lives with: lives with their family Lives in: House/apartment Stairs: Yes: Internal: 12 steps; on right going up Has following equipment at home: None  OCCUPATION: Consulting civil engineer at [employer]    PLOF: Independent, Vocation/Vocational requirements: walking and getting around school, Leisure: friends, and painting  PATIENT GOALS: Improve her knee pain, improve squatting   NEXT MD VISIT: as needed   OBJECTIVE:  Note: Objective measures were completed at Evaluation unless otherwise noted.  DIAGNOSTIC FINDINGS: none  PATIENT SURVEYS:  LEFS 33/80  COGNITION: Overall cognitive status: Within functional limits for tasks assessed     SENSATION: WFL  EDEMA:   POSTURE:  genu recurvatum , min genu valgus  PALPATION: Min pain bilateral distal to patella and laterally   LOWER EXTREMITY ROM:  Active ROM Right eval Left eval  Hip flexion    Hip extension    Hip abduction    Hip adduction    Hip internal rotation    Hip external rotation    Knee flexion 132 133  Knee extension +5 +5  Ankle dorsiflexion    Ankle plantarflexion    Ankle inversion    Ankle eversion     (Blank rows = not tested)  LOWER EXTREMITY MMT:  MMT Right eval Left eval  Hip flexion    Hip extension 4 4  Hip abduction 4 4  Hip adduction    Hip internal rotation    Hip  external rotation    Knee flexion 5 5  Knee extension 5 5  Ankle dorsiflexion    Ankle plantarflexion    Ankle inversion    Ankle eversion     (Blank rows = not tested)  LOWER EXTREMITY SPECIAL TESTS:  Hip special tests: Trendelenburg test: negative  FUNCTIONAL TESTS:  5 times sit to stand: 21.4 sec Single leg bridge 15 sec, mod diff Rt LE   GAIT: Distance walked:150  Assistive device utilized: None Level of assistance: Modified independence Comments: slow to transfer to standing, no limp, improved velocity after session                                                                                                                                 TREATMENT DATE:  [facility] Adult PT Treatment:                                                DATE: 03/18/24 Therapeutic Activity: Slant board calf stretch L QS to SLR flexion 2x10 3# L VMO SLR 2x10 3# L SAQ 2x10 3# Ball squeeze x 15 (adduction) Partial Bridge x 15 with ball  L S/L hip abduction x 15 Clam green band x 15  Hip abduction standing x15 each L Lateral step ups 6' x10 Wall slide 45d c ball squeeze x10 Manual: Rock tape to L knee, pulling patella laterally 50%  Self Care: Instructed in application of cover and rock tape for patella taping laterally  [facility] Adult PT Treatment:                                                DATE: 03/11/24 Therapeutic Activity: NuStep L6 UE and LE 6 min  Slantboard calf stretch Hip abduction standing x 10  Lateral step ups 4' x10 QS to SLR flexion x 15  VMO SLR x 15  Ball squeeze x 10 (adduction) Partial Bridge x  10 with ball  S/L hip abduction x 10 x 2  Clam green band x 15  Manual: McConnell tape to each knee, pulling patella laterally.   [facility] Adult PT Treatment:                                                DATE: 03/04/24 Therapeutic Activity: NuStep L 6 UE and LE 6 min  Slantboard calf stretch SL standing on airex, better balance on LE Hip abduction standing on airex x 15   Reverse Step up x 10  QS to SLR flexion x 15  VMO SLR x 15  Ball squeeze x 10 (adduction) Partial Bridge x 10 with ball  S/L hip abduction x 10 x 2  Clam green band x 15  Manual: McConnell tape to each knee, pulling patella laterally.     PATIENT EDUCATION:  Education details: self care, eval findings, POC, causes of knee pain  Person educated: Patient Education method: Explanation, Demonstration, and Handouts Education comprehension: verbalized understanding, returned demonstration, and needs further education  HOME EXERCISE PROGRAM: Access Code: [email or web address] URL: [email or web address] Date: 02/19/2024 Prepared by: Karie Mainland  Exercises - Single Leg Stance  - 1 x daily - 7 x weekly - 1 sets - 5 reps - 30 hold - Sidelying Hip Abduction  - 1 x daily - 7 x weekly - 2 sets - 10 reps - 5 hold - Active Straight Leg Raise with Quad Set  - 1 x daily - 7 x weekly - 2 sets - 10 reps - 5 hold - Straight Leg Raise with External Rotation  - 1 x daily - 7 x weekly - 2 sets - 10 reps - 5 hold  ASSESSMENT:  CLINICAL IMPRESSION: Pt continues to report improved bilat knee pain. The R to where she is not experiencing pain.Completed rock taping c lateral pull to the L knee. Instructed pt in how to complete and where she can purchase cover and Rock tape.  Therapeutic activity was then completed for LE strengthening with progressive demand with bias for the VMO. Pt is responding positively to PT. Pt indicates she may want to stop after next week and continue with her rehab c the HEP.   Patient is a 23 y.o. female who was seen today for physical therapy evaluation and treatment for bilateral knee pain, consistent with patellofemoral syndrome.  She has a notable trunk lean with step ups and compensates for hip weakness.    OBJECTIVE IMPAIRMENTS: Abnormal gait, decreased activity tolerance, decreased knowledge of use of DME, decreased mobility, difficulty walking, decreased ROM, decreased  strength, increased fascial restrictions, and pain.   ACTIVITY LIMITATIONS: standing, squatting, stairs, transfers, and locomotion level  PARTICIPATION LIMITATIONS: interpersonal relationship, community activity, occupation, and school  PERSONAL FACTORS: 1 comorbidity: asthma  are also affecting patient's functional outcome.   REHAB POTENTIAL: Excellent  CLINICAL DECISION MAKING: Stable/uncomplicated  EVALUATION COMPLEXITY: Low   GOALS: Goals reviewed with patient? Yes  SHORT TERM GOALS: Target date: 03/04/2024   Pt will be I with HEP for hip and knee strength Baseline:unknown  Goal status: ONGOING  2.  Pt will be able to notice reduced stiffness when getting up in the AM, doing stairs, improved 25%  Baseline: mod Goal status: ONGOING   LONG TERM GOALS: Target date: 04/15/2024    Pt will be I with HEP for  hip and knee strength, ROM  Baseline: unknown  Goal status: INITIAL  2.  Pt will improve sit to stand x 5 time to < 13 sec  Baseline: 21.4 sec  Goal status: INITIAL  3.  Pt will be able to balance on each LE in SLS for 30 sec  Baseline: 10 sec  Goal status: INITIAL  4.  Pt will be able to walk without increased knee pain for 30 min in the community Baseline: limited due to pain  Goal status: INITIAL  5.  LEFS score will improve to 45/80 at the end of the POC.  Baseline: 33/80 Goal status: INITIAL    PLAN:  PT FREQUENCY: 1x/week  PT DURATION: 8 weeks  PLANNED INTERVENTIONS: 97164- PT Re-evaluation, 97110-Therapeutic exercises, 97530- Therapeutic activity, 97112- Neuromuscular re-education, 97535- Self Care, [id number]- Manual therapy, Patient/Family education, Balance training, Taping, Cryotherapy, and Moist heat     Hasten Sweitzer MS, PT 03/18/24 2:25 PM

## 2024-03-18 ENCOUNTER — Ambulatory Visit: Payer: Medicaid Other

## 2024-03-18 DIAGNOSIS — M25561 Pain in right knee: Secondary | ICD-10-CM

## 2024-03-18 DIAGNOSIS — R262 Difficulty in walking, not elsewhere classified: Secondary | ICD-10-CM

## 2024-03-18 DIAGNOSIS — M25562 Pain in left knee: Secondary | ICD-10-CM | POA: Diagnosis not present

## 2024-03-21 ENCOUNTER — Encounter: Payer: Self-pay | Admitting: Neurology

## 2024-03-22 NOTE — Therapy (Signed)
 OUTPATIENT PHYSICAL THERAPY LOWER EXTREMITY NOTE/DC   Patient Name: Michelle Lynn MRN: [id number] DOB:2001/11/02, 23 y.o., female Today's Date: 03/25/2024  END OF SESSION:  PT End of Session - 03/25/24 1323     Visit Number 5    Number of Visits 8    Date for PT Re-Evaluation 04/15/24    Authorization Type [employer]    PT Start Time 1319    PT Stop Time 1400    PT Time Calculation (min) 41 min    Activity Tolerance Patient tolerated treatment well    Behavior During Therapy [facility] for tasks assessed/performed                 Past Medical History:  Diagnosis Date   Allergy    Asthma    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 No pertinent surgical history. 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  02/19/24 ED visit: ED Summary/Re-evaluation:  CT and CT angio were obtained which did not show acute abnormality such as ICH or dissection. Evaluated by neurology who felt that the patient had a complicated migraine.   She noticed knee pain about 2 mos ago when she started taking classes at [employer] [employer].   She used to be more active and she thinks it may because of her lack of exercise.  She played basketball, track in HS.  Prior to this injury she did workout at the gym, running  on track.   She has pain, stiffness and difficulty getting up and down stairs.  She has used braces but that did not really help. She has not had PT before.   PERTINENT HISTORY: Asthma  PAIN:  Are you having pain? Yes: NPRS scale: 0/10 for L, 0/10 for R Pain location: anterior knees Pain description: stiffness, pain, aching  Aggravating factors: weightbearing , bending knees  Relieving factors: rest  PRECAUTIONS: None  RED FLAGS: None   WEIGHT BEARING RESTRICTIONS: No  FALLS:  Has patient fallen in last 6 months? No has recently had to use the rails due to feeling off balance   LIVING ENVIRONMENT: Lives with: lives with their family Lives in: House/apartment Stairs: Yes: Internal: 12 steps; on right going up Has following equipment at home: None  OCCUPATION: Consulting civil engineer at [employer]    PLOF: Independent, Vocation/Vocational requirements: walking and getting around school, Leisure: friends, and painting  PATIENT GOALS: Improve her knee pain, improve squatting   NEXT MD VISIT: as needed   OBJECTIVE:  Note: Objective measures were completed at Evaluation unless otherwise noted.  DIAGNOSTIC FINDINGS: none  PATIENT SURVEYS:  LEFS 33/80  COGNITION: Overall cognitive status: Within functional limits for tasks assessed     SENSATION: WFL  EDEMA:   POSTURE:  genu recurvatum , min genu valgus    PALPATION: Min pain bilateral distal to patella  and laterally   LOWER EXTREMITY ROM:  Active ROM Right eval Left eval  Hip flexion    Hip extension    Hip abduction    Hip adduction    Hip internal rotation    Hip external rotation    Knee flexion 132 133  Knee extension +5 +5  Ankle dorsiflexion    Ankle plantarflexion    Ankle inversion    Ankle eversion     (Blank rows = not tested)  LOWER EXTREMITY MMT:  MMT Right eval Left eval  Hip flexion    Hip extension 4 4  Hip abduction 4 4  Hip adduction    Hip internal rotation    Hip external rotation    Knee  flexion 5 5  Knee extension 5 5  Ankle dorsiflexion    Ankle plantarflexion    Ankle inversion    Ankle eversion     (Blank rows = not tested)  LOWER EXTREMITY SPECIAL TESTS:  Hip special tests: Trendelenburg test: negative  FUNCTIONAL TESTS:  5 times sit to stand: 21.4 sec Single leg bridge 15 sec, mod diff Rt LE   GAIT: Distance walked:150  Assistive device utilized: None Level of assistance: Modified independence Comments: slow to transfer to standing, no limp, improved velocity after session                                                                                                                                 TREATMENT DATE:  [facility] Adult PT Treatment:                                                DATE: 03/25/24 Therapeutic Activity: Slant board calf stretch QS to SLR flexion x10 VMO SLR x10 Ball squeeze x 15 (adduction) Bridge x 10 with ball  S/L hip abduction x 10 Clam green band x 10 Wall slide 45d c ball squeeze x10 TKE GTB x15 Leg press 2x10 40# ball squeeze 5xSTS LEFS Updated HEP  [facility] Adult PT Treatment:                                                DATE: 03/18/24 Therapeutic Activity: Slant board calf stretch L QS to SLR flexion 2x10 3# L VMO SLR 2x10 3# L SAQ 2x10 3# Ball squeeze x 15 (adduction) Partial Bridge x 15 with ball  L S/L hip abduction x 15 Clam green band x 15  Hip abduction standing x15 each L Lateral step ups 6' x10 Wall slide 45d c ball squeeze x10 Manual: Rock tape to L knee, pulling patella laterally 50%  Self Care: Instructed in application of cover and rock tape  for patella taping laterally   PATIENT EDUCATION:  Education details: self care, eval findings, POC, causes of knee pain  Person educated: Patient Education method: Explanation, Demonstration, and Handouts Education comprehension: verbalized understanding, returned demonstration, and needs further education  HOME EXERCISE PROGRAM: Access Code:  [email or web address] URL: [email or web address] Date: 03/25/2024 Prepared by: Joellyn Rued  Exercises - Single Leg Stance  - 1 x daily - 7 x weekly - 1 sets - 5 reps - 30 hold - Sidelying Hip Abduction  - 1 x daily - 7 x weekly - 2 sets - 10 reps - 5 hold - Active Straight Leg Raise with Quad Set  - 1 x daily - 7 x weekly - 2 sets - 10 reps - 5 hold - Straight Leg Raise with External Rotation  - 1 x daily - 7 x weekly - 2 sets - 10 reps - 5 hold - Clamshell with Resistance  - 1 x daily - 7 x weekly - 2 sets - 10-15 reps - 5 hold - Supine Bridge with Mini Swiss Ball Between Knees  - 1 x daily - 7 x weekly - 2 sets - 10 reps - 5 hold - Wall Squat Hold with Ball  - 1 x daily - 7 x weekly - 2 sets - 10 reps - 5 hold - Standing Terminal Knee Extension with Resistance  - 1 x daily - 7 x weekly - 2 sets - 10 reps - 5 hold  ASSESSMENT:  CLINICAL IMPRESSION: Pt completed he course of PT today. Pt has made excellent progress re: pain, strength and function. All PT goals have been achieved. Pt is Ind with a HEP to maintain her current LOF. Pt is in agreement with DC form PT services at this time.   EVAL: Patient is a 23 y.o. female who was seen today for physical therapy evaluation and treatment for bilateral knee pain, consistent with patellofemoral syndrome.  She has a notable trunk lean with step ups and compensates for hip weakness.    OBJECTIVE IMPAIRMENTS: Abnormal gait, decreased activity tolerance, decreased knowledge of use of DME, decreased mobility, difficulty walking, decreased ROM, decreased strength, increased fascial restrictions, and pain.   ACTIVITY LIMITATIONS: standing, squatting, stairs, transfers, and locomotion level  PARTICIPATION LIMITATIONS: interpersonal relationship, community activity, occupation, and school  PERSONAL FACTORS: 1 comorbidity: asthma  are also affecting patient's functional outcome.   REHAB POTENTIAL: Excellent  CLINICAL DECISION MAKING:  Stable/uncomplicated  EVALUATION COMPLEXITY: Low   GOALS: Goals reviewed with patient? Yes  SHORT TERM GOALS: Target date: 03/04/2024   Pt will be I with HEP for hip and knee strength Baseline:unknown  Goal status: ONGOING  2.  Pt will be able to notice reduced stiffness when getting up in the AM, doing stairs, improved 25%  Baseline: mod Goal status: ONGOING   LONG TERM GOALS: Target date: 04/15/2024    Pt will be I with HEP for hip and knee strength, ROM  Baseline: unknown  Goal status: MET  2.  Pt will improve sit to stand x 5 time to < 13 sec  Baseline: 21.4 sec  03/25/24: 12.3 Goal status: MET  3.  Pt will be able to balance on each LE in SLS for 30 sec  Baseline: 10 sec  03/25/24: 30+ sec each Goal status: MET  4.  Pt will be able to walk without increased knee pain for 30 min in the community Baseline: limited due to pain  03/25/24: No pain with community ambulation Goal status:  MET  5.  LEFS score will improve to 45/80 at the end of the POC.  Baseline: 33/80 03/25/24: 75/80 Goal status: MET    PLAN:  PT FREQUENCY: 1x/week  PT DURATION: 8 weeks  PLANNED INTERVENTIONS: 97164- PT Re-evaluation, 97110-Therapeutic exercises, 97530- Therapeutic activity, 97112- Neuromuscular re-education, 97535- Self Care, 65784- Manual therapy, Patient/Family education, Balance training, Taping, Cryotherapy, and Moist heat  PHYSICAL THERAPY DISCHARGE SUMMARY  Visits from Start of Care: 5  Current functional level related to goals / functional outcomes: See clinical impression and PT goals   Remaining deficits: See clinical impression and PT goals   Education / Equipment: HEP. Pt Ed.   Patient agrees to discharge. Patient goals were met. Patient is being discharged due to meeting the stated rehab goals.      Nihar Klus MS, PT 03/25/24 2:29 PM

## 2024-03-23 ENCOUNTER — Encounter: Payer: Self-pay | Admitting: Pediatrics

## 2024-03-23 ENCOUNTER — Ambulatory Visit: Payer: Medicaid Other | Admitting: Pediatrics

## 2024-03-23 VITALS — BP 120/68 | HR 90 | Ht 68.0 in | Wt 179.2 lb

## 2024-03-23 DIAGNOSIS — Z130 Encounter for screening for diseases of the blood and blood-forming organs and certain disorders involving the immune mechanism: Secondary | ICD-10-CM | POA: Diagnosis not present

## 2024-03-23 DIAGNOSIS — I471 Supraventricular tachycardia, unspecified: Secondary | ICD-10-CM

## 2024-03-23 LAB — POCT HEMOGLOBIN: Hemoglobin: 9.9 g/dL — AB (ref 11–14.6)

## 2024-03-23 NOTE — Progress Notes (Signed)
 Subjective:    Michelle Lynn is a 23 y.o. old female here for Follow-up .    No interpreter necessary.  HPI  23 year old here for anemia recheck. Has had chronic anemia for 12 months that was recently treated and is here for recheck. Over the past 5 weeks she has taken iron supplement as prescribed every day. She was given depoprovera 1 month ago and had 2 weeks of spotting with 1 week of heavy bleeding during that time.   She reports episodes of dizziness and lightheadedness have resolved. Energy level remains low.   Hgb 8,5 1 month ago Hgb 9.9 today  Has Adolescent clinic appointment 04/11/24 for birth control  Has adult neurology appointment 07/27/24 for Has Adult cardiology referral has been made for SVT with anesthesia at dental office.   Patient is transferring care to adult medicine  Review of Systems  History and Problem List: Michelle Lynn has Mild persistent asthma; Myopia of both eyes; Learning disorder; Multiple food allergies; and  Chest pain with exercise on their problem list.  Michelle Lynn  has a past medical history of Allergy, Asthma, Learning disorder (11/01/2013), Unspecified asthma(493.90) (11/01/2013), and Vision abnormalities.  Immunizations needed: none     Objective:    BP 120/68   Pulse 90   Ht 5\' 8"  (1.727 m)   Wt 179 lb 3.2 oz (81.3 kg)   BMI 27.25 kg/m  Physical Exam Vitals reviewed.  Constitutional:      General: She is not in acute distress. Cardiovascular:     Rate and Rhythm: Normal rate and regular rhythm.     Heart sounds: No murmur heard. Pulmonary:     Effort: Pulmonary effort is normal.     Breath sounds: Normal breath sounds.  Neurological:     Mental Status: She is alert.        Results for orders placed or performed in visit on 03/23/24 (from the past 24 hours)  POCT hemoglobin     Status: Abnormal   Collection Time: 03/23/24 11:29 AM  Result Value Ref Range   Hemoglobin 9.9 (A) 11 - 14.6 g/dL       Assessment and Plan:    Michelle Lynn is a 23 y.o. old female with need for anemia recheck.  1. Screening for iron deficiency anemia (Primary) Hgb 9.9 today, slowly improving Continue iron supplement daily for 2 more months Recheck anemia in 2 months with me or in adolescent clinic - POCT hemoglobin  History SVT during a dental procedure-needs cardiac evaluation-referred to adult cardiology again today   Return if symptoms worsen or fail to improve.  Kalman Jewels, MD

## 2024-03-23 NOTE — Patient Instructions (Signed)
What Is Iron? Iron is a mineral found in plants and animals and all living things. It's an important component of hemoglobin, the part of red blood cells that carries oxygen from the lungs to the body. Iron gives hemoglobin the strength to "carry" (bind to) oxygen in the blood, so oxygen gets to where it needs to go.  Without enough iron, the body can't make hemoglobin and makes fewer red blood cells. This means tissues and organs won't get the oxygen they need.  People can get iron by eating foods like meat and dark green leafy vegetables. Iron is also added to some foods, such as infant formula and cereals.  How Much Iron Do Kids Need? Depending on their age, kids need different amounts of iron:  Infants who breastfeed tend to get enough iron from their mothers until 4-6 months of age. Around this time, rich foods like fortified cereal and pured meats are usually introduced. Breastfed babies who don't get enough iron should be given iron drops prescribed by their doctor. Babies given iron-fortified formula do not need added iron. Infants ages 7-12 months need 11 milligrams of iron a day. Toddlers ages 1-3 years need 7 milligrams of iron each day. Kids ages 4-8 years need 10 milligrams while older kids ages 9-13 years need 8 milligrams. Teen boys should get 11 milligrams of iron a day and teen girls should get 15 milligrams. (Adolescence is a time of rapid growth and teen girls need additional iron to replace what they lose monthly when they begin menstruating.) Young athletes who regularly engage in intense exercise tend to lose more iron and may need extra iron in their diets. People following a vegetarian diet might also need added iron. What's Iron Deficiency? Iron deficiency is when a person's body doesn't have enough iron. It can be a problem for some kids, particularly toddlers and teens (especially girls who have very heavy periods). In fact, many teenage girls are at risk for iron  deficiency -- even if they have normal periods -- if their diets don't contain enough iron to offset the loss of blood during menstruation.  After 12 months of age, toddlers are at risk for iron deficiency because they no longer drink iron-fortified formula -- and, they may not be eating enough iron-containing foods to make up the difference.  Iron deficiency can affect growth and may lead to learning and behavioral problems. If iron deficiency isn't corrected, it can lead to iron-deficiency anemia (a decrease in the number of red blood cells in the body).  How Can I Help My Child Get Enough Iron? Kids and teens should know that iron is an important part of a healthy diet. Foods rich in iron include:  beef, pork, poultry, and seafood tofu dried beans and peas dried fruits leafy dark green vegetables iron-fortified breakfast cereals, breads, and pastas (Note: Iron from animal sources is more easily absorbed by the body than iron from plant sources.)  To help make sure kids get enough iron:  Limit the amount of milk they drink to about 16-24 fluid ounces (473-710 milliliters) a day. Serve iron-fortified infant cereal until kids are 18-24 months old. Serve iron-rich foods alongside foods containing vitamin C (such as tomatoes, broccoli, oranges, and strawberries). Vitamin C improves the way the body absorbs iron. Avoide serving coffee or tea at mealtime -- both contain tannins that reduce the way the body absorbs iron.  

## 2024-03-25 ENCOUNTER — Ambulatory Visit: Payer: Medicaid Other | Attending: Pediatrics

## 2024-03-25 DIAGNOSIS — M25562 Pain in left knee: Secondary | ICD-10-CM | POA: Insufficient documentation

## 2024-03-25 DIAGNOSIS — R262 Difficulty in walking, not elsewhere classified: Secondary | ICD-10-CM | POA: Diagnosis not present

## 2024-03-25 DIAGNOSIS — M25561 Pain in right knee: Secondary | ICD-10-CM | POA: Diagnosis not present

## 2024-04-11 ENCOUNTER — Other Ambulatory Visit (HOSPITAL_COMMUNITY)
Admission: RE | Admit: 2024-04-11 | Discharge: 2024-04-11 | Disposition: A | Discharge status: Home / Self Care | Source: Ambulatory Visit | Attending: Family | Admitting: Family

## 2024-04-11 ENCOUNTER — Ambulatory Visit: Payer: Self-pay | Admitting: Family

## 2024-04-11 ENCOUNTER — Encounter: Payer: Self-pay | Admitting: Family

## 2024-04-11 VITALS — BP 133/77 | HR 90 | Ht 67.42 in | Wt 178.8 lb

## 2024-04-11 DIAGNOSIS — Z113 Encounter for screening for infections with a predominantly sexual mode of transmission: Secondary | ICD-10-CM | POA: Insufficient documentation

## 2024-04-11 DIAGNOSIS — Z30017 Encounter for initial prescription of implantable subdermal contraceptive: Secondary | ICD-10-CM | POA: Diagnosis not present

## 2024-04-11 DIAGNOSIS — Z3202 Encounter for pregnancy test, result negative: Secondary | ICD-10-CM

## 2024-04-11 DIAGNOSIS — N921 Excessive and frequent menstruation with irregular cycle: Secondary | ICD-10-CM

## 2024-04-11 DIAGNOSIS — K644 Residual hemorrhoidal skin tags: Secondary | ICD-10-CM

## 2024-04-11 LAB — POCT URINE PREGNANCY: Preg Test, Ur: NEGATIVE

## 2024-04-11 MED ORDER — ETONOGESTREL 68 MG ~~LOC~~ IMPL
68.0000 mg | DRUG_IMPLANT | Freq: Once | SUBCUTANEOUS | Status: AC
  Administered 2024-04-11: 68 mg via SUBCUTANEOUS

## 2024-04-11 NOTE — Patient Instructions (Signed)
Follow-up  in 1 month. Schedule this appointment before you leave clinic today.  Congratulations on getting your Nexplanon placement!  Below is some important information about Nexplanon.  First remember that Nexplanon does not prevent sexually transmitted infections.  Condoms will help prevent sexually transmitted infections. The Nexplanon starts working 7 days after it was inserted.  There is a risk of getting pregnant if you have unprotected sex in those first 7 days after placement of the Nexplanon.  The Nexplanon lasts for 3 years but can be removed at any time.  You can become pregnant as early as 1 week after removal.  You can have a new Nexplanon put in after the old one is removed if you like.  It is not known whether Nexplanon is as effective in women who are very overweight because the studies did not include many overweight women.  Nexplanon interacts with some medications, including barbiturates, bosentan, carbamazepine, felbamate, griseofulvin, oxcarbazepine, phenytoin, rifampin, St. John's wort, topiramate, HIV medicines.  Please alert your doctor if you are on any of these medicines.  Always tell other healthcare providers that you have a Nexplanon in your arm.  The Nexplanon was placed just under the skin.  Leave the outside bandage on for 24 hours.  Leave the smaller bandage on for 3-5 days or until it falls off on its own.  Keep the area clean and dry for 3-5 days. There is usually bruising or swelling at the insertion site for a few days to a week after placement.  If you see redness or pus draining from the insertion site, call us immediately.  Keep your user card with the date the implant was placed and the date the implant is to be removed.  The most common side effect is a change in your menstrual bleeding pattern.   This bleeding is generally not harmful to you but can be annoying.  Call or come in to see us if you have any concerns about the bleeding or if you have any  side effects or questions.    We will call you in 1 week to check in and we would like you to return to the clinic for a follow-up visit in 1 month.  You can call [facility] 24 hours a day with any questions or concerns.  There is always a nurse or doctor available to take your call.  Call 9-1-1 if you have a life-threatening emergency.  For anything else, please call us at [phone] before heading to the ER. 

## 2024-04-11 NOTE — Progress Notes (Signed)
 History was provided by the patient.  Michelle Lynn is a 23 y.o. female who is here for Nexplanon  insertion.   PCP confirmed? Yes.    Teresia Fennel, MD  Plan from last visit:  Michelle Lynn is a 23 y.o. old female with need for anemia recheck.   1. Screening for iron deficiency anemia (Primary) Hgb 9.9 today, slowly improving Continue iron supplement daily for 2 more months Recheck anemia in 2 months with me or in adolescent clinic - POCT hemoglobin   History SVT during a dental procedure-needs cardiac evaluation-referred to adult cardiology again today   Return if symptoms worsen or fail to improve.  Pertinent Labs:  Lab Results  Component Value Date   WBC 7.1 02/19/2024   HGB 9.9 (A) 03/23/2024   HCT 29.4 (L) 02/19/2024   MCV 57.1 (L) 02/19/2024   PLT 493 (H) 02/19/2024    Lab Results  Component Value Date   HGBA1C 5.6 07/27/2017     Last STI screenings:  HIV non-reactive 02/10/24 gc/c negative 02/11/24   Birth control:  Depo 02/10/2024  Chart/Growth Chart Review:  Wt Readings from Last 3 Encounters:  04/11/24 178 lb 12.8 oz (81.1 kg)  03/23/24 179 lb 3.2 oz (81.3 kg)  02/10/24 175 lb (79.4 kg)    HPI:  -was OK with it; just temporary depo  -the week after depo, went to rehab for knees - came home with headache, left side started to feel numb; went to ER; started to stutter, felt like tongue was numb; felt like left-side was collapsing; was told it was a migraine - given migraine cocktail - worked well; -didn't eat the morning for PT/Rehab; used to sports in HS but that was first time being that active  -no family hx of migraines  -grandmother had stroke in her 28s  -noticed vision was blurry with migraine episode  -wants the birth control in her arm  -her mom used to take depo and her older sister used to take it; she is concerned because she can't have kids now;  -taking iron supplement; no SOB, no racing heart, no fatigue  -first few 2 weeks of Depo  spotting then a period, had period again this month as expected  -[facility]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; works out sometimes; asthma well-controlled -sexually active; no pain with intercourse, no dysuria, no vaginal discharge changes  -feels like skin tag by perineum is more irritated now; when she uses the bathroom it hurts sometimes; never bleeds   Patient Active Problem List   Diagnosis Date Noted   Multiple food allergies 07/27/2017    Chest pain with exercise 07/27/2017   Mild persistent asthma 11/01/2013   Myopia of both eyes 11/01/2013   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 30 tablet 3   fluticasone  (FLOVENT  HFA) 220 MCG/ACT inhaler Inhale 2 puffs into the lungs 2 (two) times daily. 1 each 12   triamcinolone  ointment (KENALOG ) 0.1 % Apply to dry, itchy rash BID for up to 2 weeks at a time 80 g  1   clotrimazole  (LOTRIMIN ) 1 % cream Apply 1 Application topically 2 (two) times daily. For yeast skin rash (Patient not taking: Reported on 02/19/2024) 30 g 1   fluconazole  (DIFLUCAN ) 150 MG tablet Take 1 tablet by mouth after completing antibiotic course for UTI (Patient not taking: Reported on 04/11/2024) 1 tablet 0   meloxicam  (MOBIC ) 15 MG tablet Take 1 tablet daily with food for 7 days. Then take as needed. (Patient not taking: Reported on 12/23/2022) 40 tablet 0   metoCLOPramide  (REGLAN ) 10 MG tablet Take 1 tablet (10 mg total) by mouth every 6 (six) hours. (Patient not taking: Reported on 04/11/2024) 10 tablet 0   No current facility-administered medications on file prior  to visit.    Allergies  Allergen Reactions   Other Anaphylaxis, Shortness Of Breath and Swelling   Pineapple Swelling    Mother reports has had pineapple since then with no reaction    Physical Exam:    Vitals:   04/11/24 1104  BP: 133/77  Pulse: 90  Weight: 178 lb 12.8 oz (81.1 kg)  Height: 5' 7.42" (1.712 m)    Growth %ile SmartLinks can only be used for patients less than 49 years old. No LMP recorded.  Physical Exam Exam conducted with a chaperone present.  Constitutional:      General: She is not in acute distress.    Appearance: She is well-developed.  HENT:     Head: Normocephalic and atraumatic.  Eyes:     General: No scleral icterus.    Pupils: Pupils are equal, round, and reactive to light.  Neck:     Thyroid : No thyromegaly.  Cardiovascular:     Rate and Rhythm: Normal rate and regular rhythm.     Heart sounds: Normal heart sounds. No murmur heard. Pulmonary:     Effort: Pulmonary effort is normal.     Breath sounds: Normal breath sounds.  Abdominal:     Palpations: Abdomen is soft.  Genitourinary:   Musculoskeletal:        General: Normal range of motion.     Cervical back: Normal range of motion and neck supple.  Lymphadenopathy:     Cervical: No cervical adenopathy.  Skin:    General: Skin is warm and dry.     Findings: No rash.     Comments: Implant palpable in correct position LUE s/p insertion   Neurological:     Mental Status: She is alert and oriented to person, place, and time.     Cranial Nerves: No cranial nerve deficit.  Psychiatric:        Behavior: Behavior normal.        Thought Content: Thought content normal.        Judgment: Judgment normal.      Assessment/Plan:  1. Breakthrough bleeding on Depo-Provera  (Primary) 2. Encounter for initial prescription of Nexplanon  -discussed side effects, efficacy, insertion procedure, and return precautions; she is aware that the most common side effect with implant is unpredictable  bleeding; return in one month for Hgb recheck. Continue with iron supplementation as prescribed.  - etonogestrel  (NEXPLANON ) implant 68 mg - Subdermal Etonogestrel  Implant Insertion  3. Skin tag of perianal region -return precautions reviewed, no sign of irritation or infection noted today   4. Routine screening for STI (sexually transmitted infection) - Urine cytology ancillary only  5. Pregnancy examination or test, negative result - POCT urine pregnancy

## 2024-04-11 NOTE — Procedures (Signed)
Nexplanon Insertion  No contraindications for placement.  No liver disease, no unexplained vaginal bleeding, no h/o breast cancer, no h/o blood clots.  No LMP recorded.  UHCG: negative    Last Unprotected sex:  NA  Risks & benefits of Nexplanon discussed The nexplanon device was purchased and supplied by [facility]. Packaging instructions supplied to patient Consent form signed  The patient denies any allergies to anesthetics or antiseptics.  Procedure: Pt was placed in supine position. The left arm was flexed at the elbow and externally rotated so that left wrist was parallel to left ear The medial epicondyle of the left arm was identified The insertions site was marked 8 cm proximal to the medial epicondyle The insertion site was cleaned with Betadine The area surrounding the insertion site was covered with a sterile drape 1% lidocaine was injected just under the skin at the insertion site extending 4 cm proximally. The sterile preloaded disposable Nexaplanon applicator was removed from the sterile packaging The applicator needle was inserted at a 30 degree angle at 8 cm proximal to the medial epicondyle as marked The applicator was lowered to a horizontal position and advanced just under the skin for the full length of the needle The slider on the applicator was retracted fully while the applicator remained in the same position, then the applicator was removed. The implant was confirmed via palpation as being in position The implant position was demonstrated to the patient Pressure dressing was applied to the patient.  The patient was instructed to removed the pressure dressing in 24 hrs.  The patient was advised to move slowly from a supine to an upright position  The patient denied any concerns or complaints  The patient was instructed to schedule a follow-up appt in 1 month and to call sooner if any concerns.  The patient acknowledged agreement and understanding of the  plan.

## 2024-04-12 LAB — URINE CYTOLOGY ANCILLARY ONLY
Bacterial Vaginitis-Urine: NEGATIVE
Candida Urine: NEGATIVE
Chlamydia: NEGATIVE
Comment: NEGATIVE
Comment: NEGATIVE
Comment: NORMAL
Neisseria Gonorrhea: NEGATIVE
Trichomonas: NEGATIVE

## 2024-05-12 ENCOUNTER — Encounter: Admitting: Family

## 2024-06-13 ENCOUNTER — Encounter: Admitting: Family

## 2024-06-14 ENCOUNTER — Encounter: Payer: Self-pay | Admitting: *Deleted

## 2024-06-16 ENCOUNTER — Encounter: Admitting: Family

## 2024-07-21 ENCOUNTER — Ambulatory Visit: Admitting: Family

## 2024-07-21 ENCOUNTER — Other Ambulatory Visit (HOSPITAL_COMMUNITY)
Admission: RE | Admit: 2024-07-21 | Discharge: 2024-07-21 | Disposition: A | Discharge status: Home / Self Care | Source: Ambulatory Visit | Attending: Family | Admitting: Family

## 2024-07-21 ENCOUNTER — Encounter: Payer: Self-pay | Admitting: Family

## 2024-07-21 VITALS — BP 128/69 | HR 81 | Ht 67.72 in | Wt 194.2 lb

## 2024-07-21 DIAGNOSIS — Z3202 Encounter for pregnancy test, result negative: Secondary | ICD-10-CM

## 2024-07-21 DIAGNOSIS — L853 Xerosis cutis: Secondary | ICD-10-CM

## 2024-07-21 DIAGNOSIS — Z113 Encounter for screening for infections with a predominantly sexual mode of transmission: Secondary | ICD-10-CM | POA: Insufficient documentation

## 2024-07-21 DIAGNOSIS — J4530 Mild persistent asthma, uncomplicated: Secondary | ICD-10-CM

## 2024-07-21 DIAGNOSIS — N921 Excessive and frequent menstruation with irregular cycle: Secondary | ICD-10-CM

## 2024-07-21 DIAGNOSIS — K5909 Other constipation: Secondary | ICD-10-CM

## 2024-07-21 DIAGNOSIS — Z130 Encounter for screening for diseases of the blood and blood-forming organs and certain disorders involving the immune mechanism: Secondary | ICD-10-CM

## 2024-07-21 LAB — POCT HEMOGLOBIN: Hemoglobin: 11.6 g/dL (ref 11–14.6)

## 2024-07-21 LAB — POCT URINE PREGNANCY: Preg Test, Ur: NEGATIVE

## 2024-07-21 MED ORDER — TRIAMCINOLONE ACETONIDE 0.1 % EX OINT: TOPICAL_OINTMENT | CUTANEOUS | 1 refills | Status: AC

## 2024-07-21 MED ORDER — NAPROXEN 500 MG PO TABS: 500.0000 mg | ORAL_TABLET | Freq: Two times a day (BID) | ORAL | 0 refills | Status: DC | PRN

## 2024-07-21 MED ORDER — ALBUTEROL SULFATE HFA 108 (90 BASE) MCG/ACT IN AERS
INHALATION_SPRAY | RESPIRATORY_TRACT | 1 refills | Status: AC
Start: 2024-07-21 — End: ?

## 2024-07-21 MED ORDER — FLUTICASONE PROPIONATE HFA 220 MCG/ACT IN AERO
2.0000 | INHALATION_SPRAY | Freq: Two times a day (BID) | RESPIRATORY_TRACT | 12 refills | Status: AC
Start: 2024-07-21 — End: ?

## 2024-07-21 NOTE — Progress Notes (Signed)
 History was provided by the patient.  Michelle Lynn is a 23 y.o. female who is here for breakthrough bleeding with Nexplanon .   PCP confirmed? Yes.    Herminio Kirsch, MD  Plan from last visit:  1. Breakthrough bleeding on Depo-Provera  (Primary) 2. Encounter for initial prescription of Nexplanon  -discussed side effects, efficacy, insertion procedure, and return precautions; she is aware that the most common side effect with implant is unpredictable bleeding; return in one month for Hgb recheck. Continue with iron supplementation as prescribed.  - etonogestrel  (NEXPLANON ) implant 68 mg - Subdermal Etonogestrel  Implant Insertion   3. Skin tag of perianal region -return precautions reviewed, no sign of irritation or infection noted today    4. Routine screening for STI (sexually transmitted infection) - Urine cytology ancillary only   5. Pregnancy examination or test, negative result - POCT urine pregnancy  Pertinent Labs:  Lab Results  Component Value Date   HGB 11.6 07/21/2024    Chart/Growth Chart Review:  Body mass index is 29.78 kg/m.    HPI:  Cycle last 2-3 weeks, with brown discharge after  LMP 7/11-7/24, the one before that lasted 3 weeks  Laid off 2 weeks ago; 100% online fall semester - [employer]  Was in warehouse; [employer] ([employer])   Having some nausea, had one incident of projectile vomiting about 3 weeks ago immediately after eating; still having off and on stomach ache and nausea   Constipation: last BM yesterday; every day but does not feel like she is finishing up  Aloe water - a lot lately - about 2 gallons in 3 days Was drinking detox tea because she felt like she was bloated   Pertinent negatives: no dysuria, no pelvic pain, no pain with intercourse, no vaginal discharge changes, no vaginal odor     Patient Active Problem List   Diagnosis Date Noted   Multiple food allergies 07/27/2017    Chest pain with exercise 07/27/2017   Mild  persistent asthma 11/01/2013   Myopia of both eyes 11/01/2013   Learning disorder 11/01/2013    Current Outpatient Medications on File Prior to Visit  Medication Sig Dispense Refill   albuterol  (PROAIR  HFA) 108 (90 Base) MCG/ACT inhaler INHALE 2 PUFFS BY MOUTH EVERY 4 HOURS BEFPRE EXERCISE AS NEEDED FOR WHEEZING 17 g 1   clotrimazole  (LOTRIMIN ) 1 % cream Apply 1 Application topically 2 (two) times daily. For yeast skin rash (Patient not taking: Reported on 02/19/2024) 30 g 1   EPINEPHrine  0.3 mg/0.3 mL IJ SOAJ injection INJECT INTO MUSCLE AS A SINGLE DOSE, NOTIFY 911 IF USED 2 each 1   ferrous sulfate  325 (65 FE) MG tablet Take 1 tablet (325 mg total) by mouth daily with breakfast. 30 tablet 3   fluconazole  (DIFLUCAN ) 150 MG tablet Take 1 tablet by mouth after completing antibiotic course for UTI (Patient not taking: Reported on 04/11/2024) 1 tablet 0   fluticasone  (FLOVENT  HFA) 220 MCG/ACT inhaler Inhale 2 puffs into the lungs 2 (two) times daily. 1 each 12   meloxicam  (MOBIC ) 15 MG tablet Take 1 tablet daily with food for 7 days. Then take as needed. (Patient not taking: Reported on 12/23/2022) 40 tablet 0   metoCLOPramide  (REGLAN ) 10 MG tablet Take 1 tablet (10 mg total) by mouth every 6 (six) hours. (Patient not taking: Reported on 04/11/2024) 10 tablet 0   triamcinolone  ointment (KENALOG ) 0.1 % Apply to dry, itchy rash BID for up to 2 weeks at a time 80 g 1  No current facility-administered medications on file prior to visit.    Allergies  Allergen Reactions   Other Anaphylaxis, Shortness Of Breath and Swelling   Pineapple Swelling    Mother reports has had pineapple since then with no reaction    Physical Exam:    Vitals:   07/21/24 0850  BP: 128/69  Pulse: 81  Weight: 194 lb 3.2 oz (88.1 kg)  Height: 5' 7.72 (1.72 m)   Wt Readings from Last 3 Encounters:  07/21/24 194 lb 3.2 oz (88.1 kg)  04/11/24 178 lb 12.8 oz (81.1 kg)  03/23/24 179 lb 3.2 oz (81.3 kg)     Growth %ile  SmartLinks can only be used for patients less than 46 years old. No LMP recorded.  Physical Exam Constitutional:      General: She is not in acute distress.    Appearance: She is well-developed.  HENT:     Head: Normocephalic and atraumatic.     Mouth/Throat:     Mouth: Mucous membranes are moist.  Eyes:     General: No scleral icterus.    Extraocular Movements: Extraocular movements intact.     Pupils: Pupils are equal, round, and reactive to light.  Cardiovascular:     Rate and Rhythm: Normal rate and regular rhythm.     Heart sounds: Normal heart sounds. No murmur heard. Pulmonary:     Effort: Pulmonary effort is normal.     Breath sounds: Normal breath sounds.  Abdominal:     General: There is no distension.  Musculoskeletal:        General: Normal range of motion.     Cervical back: Normal range of motion and neck supple.  Skin:    General: Skin is warm and dry.     Capillary Refill: Capillary refill takes less than 2 seconds.     Findings: No rash.     Comments: Implant palpable in LUE correct position   Neurological:     Mental Status: She is alert and oriented to person, place, and time.     Cranial Nerves: No cranial nerve deficit.     Motor: No tremor.  Psychiatric:        Attention and Perception: Attention normal.        Mood and Affect: Mood normal.        Speech: Speech normal.        Behavior: Behavior normal.        Thought Content: Thought content normal.        Judgment: Judgment normal.      Assessment/Plan: 1. Breakthrough bleeding on Nexplanon  (Primary) -advised Naproxen  use for breakthrough bleeding as needed; no associated symptoms of itching,odor or pelvic pain or cramping or dyspareunia. Will screen for gc/c today to rule out infection.   2. Other constipation -discussed symptoms consistent with constipation - bloating, nausea, decreased appetite; recommended decrease aloe water and detox tea use (overuse or unpurified may be contributing to  symptoms); ok to continue with hydration with regular water - complete clean-out; return precautions reviewed  -16 lb weight gain since April; continue to monitor symptoms; UPT negative today; consider TFTs iand additional work-up if still symptomatic.  3. Screening for iron deficiency anemia -has improved with iron supplementation; continue  - POCT hemoglobin  4. Mild persistent asthma without complication -refill sent per patient request - albuterol  (PROAIR  HFA) 108 (90 Base) MCG/ACT inhaler; INHALE 2 PUFFS BY MOUTH EVERY 4 HOURS BEFPRE EXERCISE AS NEEDED FOR WHEEZING  Dispense: 17  g; Refill: 1 - fluticasone  (FLOVENT  HFA) 220 MCG/ACT inhaler; Inhale 2 puffs into the lungs 2 (two) times daily.  Dispense: 1 each; Refill: 12  5. Dry skin dermatitis -refill sent; no flare noted today  - triamcinolone  ointment (KENALOG ) 0.1 %; Apply to dry, itchy rash BID for up to 2 weeks at a time  Dispense: 80 g; Refill: 1  6. Pregnancy examination or test, negative result - POCT urine pregnancy  7. Routine screening for STI (sexually transmitted infection) - Urine cytology ancillary only   Follow-up: One week video visit to reassess.

## 2024-07-21 NOTE — Patient Instructions (Signed)
 You are constipated and need help to clean out the large amount of stool (poop) in the intestine. This guide tells you what medicine to use.  What do I need to know before starting the clean out?  It will take about 4 to 6 hours to take the medicine.  After taking the medicine, you should have a large stool within 24 hours.  Plan to stay close to a bathroom until the stool has passed. After the intestine is cleaned out, you will need to take a daily medicine.   Remember:  Constipation can last a long time. It may take 6 to 12 months for you to get back to regular bowel movements (BMs). Be patient. Things will get better slowly over time.  If you have questions, call your doctor at this number:     [phone]   When should you start the clean out?  Start the home clean out on a Friday afternoon or some other time when you will be home (and not at school).  Start between 2:00 and 4:00 in the afternoon.  You should have almost clear liquid stools by the end of the next day. If the medicine does not work or you don't know if it worked, Physicist, medical or nurse.  What medicine do I need to take?  You need to take Miralax, a powder that you mix in a clear liquid.  Follow these steps: ?    Stir the Miralax powder into water, juice, or Gatorade. Your Miralax dose is: 8 capfuls of Miralax powder in 32 ounces of liquid ?    Drink 4 to 8 ounces every 30 minutes. It will take 4 to 6 hours to finish the medicine. ?    After the medicine is gone, drink more water or juice. This will help with the cleanout.   -     If the medicine gives you an upset stomach, slow down or stop.   Does I need to keep taking medicine?                                                                                                      After the clean out, you will take a daily (maintenance) medicine for at least 6 months. Your Miralax dose is:      1-2 capful of powder in 8 ounces of liquid every day  (depending on how clean-out goes, we will discuss this more before you start!)   You should go to the doctor for follow-up appointments as directed.  What if I get constipated again?  Some people need to have the clean out more than one time for the problem to go away. Contact your doctor to ask if you should repeat the clean out. It is OK to do it again, but you should wait at least a week before repeating the clean out.    Will I have any problems with the medicine?   You may have stomach pain or cramping during the clean out. This might mean you have to  go to the bathroom.   Take some time to sit on the toilet. The pain will go away when the stool is gone. You may want to read while you wait. A warm bath may also help.   What should I eat and drink?  Drink lots of water and juice. Fruits and vegetables are good foods to eat. Try to avoid greasy and fatty foods.

## 2024-07-22 ENCOUNTER — Ambulatory Visit: Admitting: Neurology

## 2024-07-22 LAB — URINE CYTOLOGY ANCILLARY ONLY
Chlamydia: NEGATIVE
Comment: NEGATIVE
Comment: NEGATIVE
Comment: NORMAL
Neisseria Gonorrhea: NEGATIVE
Trichomonas: NEGATIVE

## 2024-07-26 NOTE — Progress Notes (Addendum)
 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 Migraine prevention:  Before considering pharmacologic management, she would like to address lifestyle modification.  She may also consider vitamins/supplements.  I advised that if there is no improvement in no longer than 3 months, contact me and we can initiate a medication. Migraine rescue:  Will have her try samples of Ubrelvy 100mg .  Due to hemiplegic migraine, would not use triptans. Limit use of pain relievers to no more than 9 days out of the month to prevent risk of rebound or medication-overuse headache. Keep headache diary Follow up in 8 months.   Subjective:  Michelle Lynn is a 23 year old right-handed female who presents for migraine.  History supplemented by referring provider's note.  CT/CTA personally reviewed.  On 02/19/2024, she woke up with severe right sided throbbing headache.  She had associated left sided numbness involving the left side of face, torso, arm and spine as well as some associated left arm weakness, difficulty speaking/stuttering, and dizziness.  She went to the ED.  CT head and CTA head and neck were normal.  Stroke was not suspected.  Neurology believed symptoms were migraine and did not feel MRI was warranted.  She received a headache cocktail which helped.  Since then, she has continued to have headaches, 5-6/10 bilateral frontal throbbing headache that spreads to back of her head.  Associated with nausea, photophobia,  phonophobia and lburred vision.  It lasts 1 to 2 hours off and on throughout the day and occurring 3 to 4 times a week.  Sometimes may take an Aleve  but usually does not treat them.  She did have one severe headache a couple of days ago with prominent nausea and abdominal pain, but has not had any recurrence of left sided numbness and weakness.    Past NSAIDS/analgesics:  acetaminophen Past abortive triptans:  none Past abortive ergotamine:  none Past muscle relaxants:  none Past anti-emetic:  none Past antihypertensive medications:  none Past antidepressant medications:  none Past anticonvulsant medications:  none Past anti-CGRP:  none Other past therapies:  none  Current NSAIDS/analgesics:  naproxen  Current triptans:  none Current ergotamine:  none Current anti-emetic:  none Current muscle relaxants:  none Current Antihypertensive medications:  none Current Antidepressant medications:  none Current Anticonvulsant medications:  none Current anti-CGRP:  none Other therapy:  none Birth control:  Nexplanon    Caffeine:  1 cup day.  Tea Alcohol:  no Diet:  Hydrates.  Skips breakfast Exercise:  no Depression:  no; Anxiety:  yes Sleep hygiene:  Good  History of TBI:  Hit in head with basketball in highschool.  Hit head after falling and hitting her head on metal rail. Family history of headache:  mom (migraines) Family history of aneurysms:  no      PAST MEDICAL HISTORY: Past Medical History:  Diagnosis Date   Allergy    Asthma    Learning disorder 11/01/2013   Unspecified asthma(493.90) 11/01/2013   Vision abnormalities     PAST SURGICAL HISTORY: No past surgical history on file.  MEDICATIONS: Current Outpatient Medications  on File Prior to Visit  Medication Sig Dispense Refill   albuterol  (PROAIR  HFA) 108 (90 Base) MCG/ACT inhaler INHALE 2 PUFFS BY MOUTH EVERY 4 HOURS BEFPRE EXERCISE AS NEEDED FOR WHEEZING 17 g 1   EPINEPHrine  0.3 mg/0.3 mL IJ SOAJ injection  INJECT INTO MUSCLE AS A SINGLE DOSE, NOTIFY 911 IF USED 2 each 1   ferrous sulfate  325 (65 FE) MG tablet Take 1 tablet (325 mg total) by mouth daily with breakfast. 30 tablet 3   fluticasone  (FLOVENT  HFA) 220 MCG/ACT inhaler Inhale 2 puffs into the lungs 2 (two) times daily. 1 each 12   naproxen  (NAPROSYN ) 500 MG tablet Take 1 tablet (500 mg total) by mouth 2 (two) times daily as needed. Take with food. 30 tablet 0   triamcinolone  ointment (KENALOG ) 0.1 % Apply to dry, itchy rash BID for up to 2 weeks at a time 80 g 1   No current facility-administered medications on file prior to visit.    ALLERGIES: Allergies  Allergen Reactions   Other Anaphylaxis, Shortness Of Breath and Swelling   Pineapple Swelling    Mother reports has had pineapple since then with no reaction    FAMILY HISTORY: Family History  Problem Relation Age of Onset   Hypertension Mother    Depression Mother    Hypertension Maternal Grandmother    Diabetes Maternal Grandmother    Depression Maternal Grandmother    Stroke Maternal Grandmother    Diabetes Paternal Grandmother     Objective:  Blood pressure 129/74, pulse 94, height 5' 8 (1.727 m), weight 193 lb (87.5 kg), last menstrual period 07/25/2024, SpO2 99%. General: No acute distress.  Patient appears well-groomed.   Head:  Normocephalic/atraumatic Eyes:  fundi examined but not visualized Neck: supple, no paraspinal tenderness, full range of motion Heart: regular rate and rhythm Neurological Exam: Mental status: alert and oriented to person, place, and time, speech fluent and not dysarthric, language intact. Cranial nerves: CN I: not tested CN II: pupils equal, round and reactive to light, visual fields intact CN III, IV, VI:  full range of motion, no nystagmus, no ptosis CN V: facial sensation intact. CN VII: upper and lower face symmetric CN VIII: hearing intact CN IX, X: gag intact, uvula midline CN XI: sternocleidomastoid and trapezius muscles  intact CN XII: tongue midline Bulk & Tone: normal, no fasciculations. Motor:  muscle strength 5/5 throughout Sensation:  Pinprick and vibratory sensation intact. Deep Tendon Reflexes:  2+ throughout,  toes downgoing.   Finger to nose testing:  Without dysmetria.   Gait:  Normal station and stride.  Romberg negative.    Thank you for allowing me to take part in the care of this patient.  Juliene Dunnings, DO  CC: Clotilda Hasten, MD

## 2024-07-27 ENCOUNTER — Encounter: Payer: Self-pay | Admitting: Neurology

## 2024-07-27 ENCOUNTER — Ambulatory Visit (INDEPENDENT_AMBULATORY_CARE_PROVIDER_SITE_OTHER): Admitting: Neurology

## 2024-07-27 VITALS — BP 129/74 | HR 94 | Ht 68.0 in | Wt 193.0 lb

## 2024-07-27 DIAGNOSIS — G43409 Hemiplegic migraine, not intractable, without status migrainosus: Secondary | ICD-10-CM | POA: Diagnosis not present

## 2024-07-27 DIAGNOSIS — G43009 Migraine without aura, not intractable, without status migrainosus: Secondary | ICD-10-CM

## 2024-07-27 NOTE — Patient Instructions (Addendum)
  MRI BRAIN WITH AND WITHOUT CONTRAST Try lifestyle modification.  If no improvement in no more than 3 months, contact me and we can start a preventative medication Take UBRELVY at earliest onset of headache.  May repeat dose once in 2 hours if needed.  Maximum 2 tablets in 24 hours.  Let me know if it works Limit use of pain relievers to no more than 9 days out of the month.  These medications include acetaminophen, NSAIDs (ibuprofen /Advil /Motrin , naproxen /Aleve , triptans (Imitrex/sumatriptan), Excedrin, and narcotics.  This will help reduce risk of rebound headaches. Be aware of common food triggers:  - Caffeine:  coffee, black tea, cola, Mt. Dew  - Chocolate  - Dairy:  aged cheeses (brie, blue, cheddar, gouda, Parmasan, provolone, romano, Swiss, etc), chocolate milk, buttermilk, sour cream, limit eggs and yogurt  - Nuts, peanut butter  - Alcohol  - Cereals/grains:  FRESH breads (fresh bagels, sourdough, doughnuts), yeast productions  - Processed/canned/aged/cured meats (pre-packaged deli meats, hotdogs)  - MSG/glutamate:  soy sauce, flavor enhancer, pickled/preserved/marinated foods  - Sweeteners:  aspartame (Equal, Nutrasweet).  Sugar and Splenda are okay  - Vegetables:  legumes (lima beans, lentils, snow peas, fava beans, pinto peans, peas, garbanzo beans), sauerkraut, onions, olives, pickles  - Fruit:  avocados, bananas, citrus fruit (orange, lemon, grapefruit), mango  - Other:  Frozen meals, macaroni and cheese Routine exercise Stay adequately hydrated (aim for 64 oz water daily) Keep headache diary Maintain proper stress management Maintain proper sleep hygiene Do not skip meals Consider supplements:  magnesium citrate 400mg  daily, riboflavin 400mg  daily, coenzyme Q10 100mg  three times daily.

## 2024-07-28 ENCOUNTER — Encounter: Payer: Self-pay | Admitting: Neurology

## 2024-08-03 ENCOUNTER — Telehealth: Admitting: Family

## 2024-08-03 ENCOUNTER — Encounter: Payer: Self-pay | Admitting: Family

## 2024-08-03 DIAGNOSIS — N921 Excessive and frequent menstruation with irregular cycle: Secondary | ICD-10-CM

## 2024-08-03 DIAGNOSIS — K5909 Other constipation: Secondary | ICD-10-CM

## 2024-08-03 MED ORDER — POLYETHYLENE GLYCOL 3350 17 GM/SCOOP PO POWD: ORAL | 0 refills | Status: AC

## 2024-08-03 MED ORDER — NAPROXEN 500 MG PO TABS: 500.0000 mg | ORAL_TABLET | Freq: Two times a day (BID) | ORAL | 2 refills | Status: AC | PRN

## 2024-08-03 NOTE — Progress Notes (Signed)
 THIS RECORD MAY CONTAIN CONFIDENTIAL INFORMATION THAT SHOULD NOT BE RELEASED WITHOUT REVIEW OF THE SERVICE PROVIDER.  Virtual Follow-Up Visit via Video Note  I connected with Michelle Lynn  on 08/03/24 at  3:00 PM EDT by a video enabled telemedicine application and verified that I am speaking with the correct person using two identifiers.   Patient/parent location: home Provider location: remote, [place]   I discussed the limitations of evaluation and management by telemedicine and the availability of in person appointments.  I discussed that the purpose of this telehealth visit is to provide medical care while limiting exposure to the novel coronavirus.  The patient expressed understanding and agreed to proceed.   Michelle Lynn is a 23 y.o. female referred by Herminio Kirsch, MD here today for follow-up of breakthrough bleeding on Nexplanon , constipation.   History was provided by the patient.  Supervising Physician: Dr. Kreg Helena   Plan from Last Visit:   1. Breakthrough bleeding on Nexplanon  (Primary) -advised Naproxen  use for breakthrough bleeding as needed; no associated symptoms of itching,odor or pelvic pain or cramping or dyspareunia. Will screen for gc/c today to rule out infection.    2. Other constipation -discussed symptoms consistent with constipation - bloating, nausea, decreased appetite; recommended decrease aloe water and detox tea use (overuse or unpurified may be contributing to symptoms); ok to continue with hydration with regular water - complete clean-out; return precautions reviewed  -16 lb weight gain since April; continue to monitor symptoms; UPT negative today; consider TFTs iand additional work-up if still symptomatic.   3. Screening for iron deficiency anemia -has improved with iron supplementation; continue  - POCT hemoglobin   4. Mild persistent asthma without complication -refill sent per patient request - albuterol  (PROAIR  HFA) 108 (90  Base) MCG/ACT inhaler; INHALE 2 PUFFS BY MOUTH EVERY 4 HOURS BEFPRE EXERCISE AS NEEDED FOR WHEEZING  Dispense: 17 g; Refill: 1 - fluticasone  (FLOVENT  HFA) 220 MCG/ACT inhaler; Inhale 2 puffs into the lungs 2 (two) times daily.  Dispense: 1 each; Refill: 12   5. Dry skin dermatitis -refill sent; no flare noted today  - triamcinolone  ointment (KENALOG ) 0.1 %; Apply to dry, itchy rash BID for up to 2 weeks at a time  Dispense: 80 g; Refill: 1   6. Pregnancy examination or test, negative result - POCT urine pregnancy   7. Routine screening for STI (sexually transmitted infection) - Urine cytology ancillary only    Chief Complaint: Still bleeding but lighter  History of Present Illness:  -had started last week, thought Naproxen  was working but period came back after one day gone  -not heavy  -stopped drinking aloe water and bloating has improved  -did not get the Miralax  but got the other Rxs from last visit  -does feel Naproxen  is helping with symptoms  -overall, OK with implant   Review of Systems  Constitutional:  Negative for chills, fever and malaise/fatigue.  Eyes:  Positive for blurred vision (some at night watching TV).  Respiratory:  Negative for shortness of breath.   Cardiovascular:  Negative for chest pain.  Gastrointestinal:  Positive for constipation. Negative for abdominal pain, nausea and vomiting.  Musculoskeletal:  Negative for myalgias.  Neurological:  Negative for dizziness and headaches.  Psychiatric/Behavioral:  The patient does not have insomnia.      Allergies  Allergen Reactions   Other Anaphylaxis, Shortness Of Breath and Swelling   Pineapple Swelling    Mother reports has had pineapple since then with no reaction  Outpatient Medications Prior to Visit  Medication Sig Dispense Refill   albuterol  (PROAIR  HFA) 108 (90 Base) MCG/ACT inhaler INHALE 2 PUFFS BY MOUTH EVERY 4 HOURS BEFPRE EXERCISE AS NEEDED FOR WHEEZING 17 g 1   EPINEPHrine  0.3 mg/0.3 mL  IJ SOAJ injection INJECT INTO MUSCLE AS A SINGLE DOSE, NOTIFY 911 IF USED 2 each 1   etonogestrel  (NEXPLANON ) 68 MG IMPL implant 1 each by Subdermal route once.     ferrous sulfate  325 (65 FE) MG tablet Take 1 tablet (325 mg total) by mouth daily with breakfast. 30 tablet 3   fluticasone  (FLOVENT  HFA) 220 MCG/ACT inhaler Inhale 2 puffs into the lungs 2 (two) times daily. 1 each 12   naproxen  (NAPROSYN ) 500 MG tablet Take 1 tablet (500 mg total) by mouth 2 (two) times daily as needed. Take with food. 30 tablet 0   triamcinolone  ointment (KENALOG ) 0.1 % Apply to dry, itchy rash BID for up to 2 weeks at a time 80 g 1   No facility-administered medications prior to visit.     Patient Active Problem List   Diagnosis Date Noted   Multiple food allergies 07/27/2017    Chest pain with exercise 07/27/2017   Mild persistent asthma 11/01/2013   Myopia of both eyes 11/01/2013   Learning disorder 11/01/2013    The following portions of the patient's history were reviewed and updated as appropriate: allergies, current medications, past family history, past medical history, past social history, past surgical history, and problem list.  Visual Observations/Objective:   General Appearance: Well nourished well developed, in no apparent distress.  Eyes: conjunctiva no swelling or erythema ENT/Mouth: No hoarseness, No cough for duration of visit.  Neck: Supple  Respiratory: Respiratory effort normal, normal rate, no retractions or distress.   Cardio: Appears well-perfused, noncyanotic Musculoskeletal: no obvious deformity Skin: visible skin without rashes, ecchymosis, erythema Neuro: Awake and oriented X 3,  Psych:  normal affect, Insight and Judgment appropriate.    Assessment/Plan: 1. Breakthrough bleeding on Nexplanon  (Primary) -bleeding is lighter and some improvement with Naproxen  use  -reviewed avoid overuse and not intended for long-term use of symptom management -will follow up in 2 months  to see how bleeding pattern is at that time and assess if further work-up is indicated; overall she is happy with implant.  -return precautions reviewed  2. Other constipation -Miralax  Rx sent; advised on daily dose for maintenance vs. Cleanout dosing  -improvement in bloated symptoms since stopping aloe water intake   I discussed the assessment and treatment plan with the patient and/or parent/guardian.  They were provided an opportunity to ask questions and all were answered.  They agreed with the plan and demonstrated an understanding of the instructions. They were advised to call back or seek an in-person evaluation in the emergency room if the symptoms worsen or if the condition fails to improve as anticipated.   Follow-up:   2 months or sooner if needed; video or in person   Bari CHRISTELLA Molt, NP    CC: Herminio Kirsch, MD, Herminio Kirsch, MD

## 2024-08-05 ENCOUNTER — Encounter: Payer: Self-pay | Admitting: Neurology

## 2024-08-19 ENCOUNTER — Ambulatory Visit: Admitting: Neurology

## 2024-08-20 ENCOUNTER — Ambulatory Visit
Admission: RE | Admit: 2024-08-20 | Discharge: 2024-08-20 | Disposition: A | Discharge status: Home / Self Care | Source: Ambulatory Visit | Attending: Neurology | Admitting: Neurology

## 2024-08-20 DIAGNOSIS — G43409 Hemiplegic migraine, not intractable, without status migrainosus: Secondary | ICD-10-CM

## 2024-08-20 DIAGNOSIS — G43009 Migraine without aura, not intractable, without status migrainosus: Secondary | ICD-10-CM

## 2024-08-20 MED ORDER — GADOPICLENOL 0.5 MMOL/ML IV SOLN
10.0000 mL | Freq: Once | INTRAVENOUS | Status: AC | PRN
  Administered 2024-08-20: 10 mL via INTRAVENOUS

## 2024-08-23 ENCOUNTER — Ambulatory Visit: Payer: Self-pay | Admitting: Neurology

## 2024-08-24 DIAGNOSIS — M25571 Pain in right ankle and joints of right foot: Secondary | ICD-10-CM | POA: Diagnosis not present

## 2024-08-24 NOTE — Progress Notes (Signed)
I advised patient of MRI results, voiced understanding and thanked me for calling.

## 2025-01-07 ENCOUNTER — Emergency Department (HOSPITAL_COMMUNITY)
Admission: EM | Admit: 2025-01-07 | Discharge: 2025-01-07 | Disposition: A | Discharge status: Home / Self Care | Attending: Emergency Medicine | Admitting: Emergency Medicine

## 2025-01-07 NOTE — ED Triage Notes (Signed)
 Pt presents to ED via EMS from [employer] [facility]. Hit head on a bar in indoor playground and fell backwards with LOC approx 3 minutes. Reports shoulder, back and neck pain bilaterally. C-collar in place. 18 LAC. 100 mcg fentanyl given.   118/72 80 HR 99 BG 96

## 2025-03-27 ENCOUNTER — Ambulatory Visit: Admitting: Neurology
